# Patient Record
Sex: Female | Born: 1985 | Race: Black or African American | Hispanic: No | Marital: Married | Smoking: Never smoker
Health system: Southern US, Community
[De-identification: ages and names within clinical notes are randomized; demographics above are authoritative.]

## PROBLEM LIST (undated history)

## (undated) DIAGNOSIS — J45909 Unspecified asthma, uncomplicated: Secondary | ICD-10-CM

## (undated) DIAGNOSIS — L309 Dermatitis, unspecified: Secondary | ICD-10-CM

## (undated) HISTORY — PX: NO PAST SURGERIES: SHX2092

## (undated) HISTORY — DX: Dermatitis, unspecified: L30.9

---

## 2018-05-05 ENCOUNTER — Encounter: Payer: Self-pay | Admitting: Obstetrics

## 2018-05-05 ENCOUNTER — Ambulatory Visit (INDEPENDENT_AMBULATORY_CARE_PROVIDER_SITE_OTHER): Payer: Managed Care, Other (non HMO) | Admitting: Obstetrics

## 2018-05-05 VITALS — BP 121/76 | HR 111 | Wt 133.0 lb

## 2018-05-05 DIAGNOSIS — Z3482 Encounter for supervision of other normal pregnancy, second trimester: Secondary | ICD-10-CM

## 2018-05-05 DIAGNOSIS — Z1151 Encounter for screening for human papillomavirus (HPV): Secondary | ICD-10-CM | POA: Diagnosis not present

## 2018-05-05 DIAGNOSIS — Z113 Encounter for screening for infections with a predominantly sexual mode of transmission: Secondary | ICD-10-CM

## 2018-05-05 DIAGNOSIS — N898 Other specified noninflammatory disorders of vagina: Secondary | ICD-10-CM

## 2018-05-05 DIAGNOSIS — Z124 Encounter for screening for malignant neoplasm of cervix: Secondary | ICD-10-CM

## 2018-05-05 DIAGNOSIS — Z3490 Encounter for supervision of normal pregnancy, unspecified, unspecified trimester: Secondary | ICD-10-CM | POA: Insufficient documentation

## 2018-05-05 MED ORDER — VITAFOL-NANO 18-0.6-0.4 MG PO TABS: 1.0000 | ORAL_TABLET | Freq: Every day | ORAL | 4 refills | Status: AC

## 2018-05-05 NOTE — Progress Notes (Signed)
Subjective:    Gwendolyn Wright is being seen today for her first obstetrical visit.  This is a planned pregnancy. She is at [redacted]w[redacted]d gestation. Her obstetrical history is significant for none. Relationship with FOB: spouse, living together. Patient does intend to breast feed. Pregnancy history fully reviewed.  The information documented in the HPI was reviewed and verified.  Menstrual History: OB History    Gravida  1   Para      Term      Preterm      AB      Living        SAB      TAB      Ectopic      Multiple      Live Births               Patient's last menstrual period was 01/10/2018.    History reviewed. No pertinent past medical history.  History reviewed. No pertinent surgical history.   (Not in a hospital admission) Allergies  Allergen Reactions  . Peanut-Containing Drug Products     Social History   Tobacco Use  . Smoking status: Never Smoker  . Smokeless tobacco: Never Used  Substance Use Topics  . Alcohol use: Not Currently    Family History  Problem Relation Age of Onset  . Uterine cancer Mother   . Hypertension Mother   . Diabetes Mother   . Anemia Mother      Review of Systems Constitutional: negative for weight loss Gastrointestinal: negative for vomiting Genitourinary:negative for genital lesions and vaginal discharge and dysuria Musculoskeletal:negative for back pain Behavioral/Psych: negative for abusive relationship, depression, illegal drug usage and tobacco use    Objective:    BP 121/76   Pulse (!) 111   Wt 133 lb (60.3 kg)   LMP 01/10/2018  General Appearance:    Alert, cooperative, no distress, appears stated age  Head:    Normocephalic, without obvious abnormality, atraumatic  Eyes:    PERRL, conjunctiva/corneas clear, EOM's intact, fundi    benign, both eyes  Ears:    Normal TM's and external ear canals, both ears  Nose:   Nares normal, septum midline, mucosa normal, no drainage    or sinus tenderness  Throat:    Lips, mucosa, and tongue normal; teeth and gums normal  Neck:   Supple, symmetrical, trachea midline, no adenopathy;    thyroid:  no enlargement/tenderness/nodules; no carotid   bruit or JVD  Back:     Symmetric, no curvature, ROM normal, no CVA tenderness  Lungs:     Clear to auscultation bilaterally, respirations unlabored  Chest Wall:    No tenderness or deformity   Heart:    Regular rate and rhythm, S1 and S2 normal, no murmur, rub   or gallop  Breast Exam:    No tenderness, masses, or nipple abnormality  Abdomen:     Soft, non-tender, bowel sounds active all four quadrants,    no masses, no organomegaly  Genitalia:    Normal female without lesion, discharge or tenderness  Extremities:   Extremities normal, atraumatic, no cyanosis or edema  Pulses:   2+ and symmetric all extremities  Skin:   Skin color, texture, turgor normal, no rashes or lesions  Lymph nodes:   Cervical, supraclavicular, and axillary nodes normal  Neurologic:   CNII-XII intact, normal strength, sensation and reflexes    throughout      Lab Review Urine pregnancy test Labs reviewed yes Radiologic studies reviewed  no Assessment:    Pregnancy at [redacted]w[redacted]d weeks    Plan:     1. Encounter for supervision of normal pregnancy, antepartum, unspecified gravidity Rx: - Obstetric Panel, Including HIV - Culture, OB Urine - Genetic Screening - Hemoglobinopathy evaluation - Cystic Fibrosis Mutation 97 - SMN1 COPY NUMBER ANALYSIS (SMA Carrier Screen) - Cytology - PAP - AFP, Serum, Open Spina Bifida - [place] MFM OB COMP + 53 WK; Future  2. Vaginal discharge Rx: - Cervicovaginal ancillary only  Prenatal vitamins.  Counseling provided regarding continued use of seat belts, cessation of alcohol consumption, smoking or use of illicit drugs; infection precautions i.e., influenza/TDAP immunizations, toxoplasmosis,CMV, parvovirus, listeria and varicella; workplace safety, exercise during pregnancy; routine dental care, safe  medications, sexual activity, hot tubs, saunas, pools, travel, caffeine use, fish and methlymercury, potential toxins, hair treatments, varicose veins Weight gain recommendations per IOM guidelines reviewed: underweight/BMI< 18.5--> gain 28 - 40 lbs; normal weight/BMI 18.5 - 24.9--> gain 25 - 35 lbs; overweight/BMI 25 - 29.9--> gain 15 - 25 lbs; obese/BMI >30->gain  11 - 20 lbs Problem list reviewed and updated. FIRST/CF mutation testing/NIPT/QUAD SCREEN/fragile X/Ashkenazi Jewish population testing/Spinal muscular atrophy discussed: requested. Role of ultrasound in pregnancy discussed; fetal survey: requested. Amniocentesis discussed: not indicated.  No orders of the defined types were placed in this encounter.  Orders Placed This Encounter  Procedures  . Culture, OB Urine  . [place] MFM OB COMP + 14 WK    Standing Status:   Future    Standing Expiration Date:   07/06/2019    Order Specific Question:   Reason for Exam (SYMPTOM  OR DIAGNOSIS REQUIRED)    Answer:   Anatomy    Order Specific Question:   Preferred Imaging Location?    Answer:   [facility]  . Obstetric Panel, Including HIV  . Genetic Screening    [facility]  . Hemoglobinopathy evaluation  . Cystic Fibrosis Mutation 97  . SMN1 COPY NUMBER ANALYSIS (SMA Carrier Screen)  . AFP, Serum, Open Spina Bifida    Order Specific Question:   Is patient insulin dependent?    Answer:   No    Order Specific Question:   Patient weight (lb.)    Answer:   133 lb (60.3 kg)    Order Specific Question:   Gestational Age (GA), weeks    Answer:   85.3    Order Specific Question:   Date on which patient was at this GA    Answer:   05/05/2018    Order Specific Question:   GA Calculation Method    Answer:   LMP    Order Specific Question:   Number of fetuses    Answer:   1    Follow up in 4 weeks. 50% of 20 min visit spent on counseling and coordination of care.     Shelly Bombard MD 05-05-2018

## 2018-05-06 LAB — CERVICOVAGINAL ANCILLARY ONLY
Chlamydia: NEGATIVE
Neisseria Gonorrhea: NEGATIVE
TRICH (WINDOWPATH): NEGATIVE

## 2018-05-07 LAB — CYTOLOGY - PAP
Diagnosis: NEGATIVE
HPV: NOT DETECTED

## 2018-05-07 LAB — AFP, SERUM, OPEN SPINA BIFIDA
AFP MoM: 0.73
AFP Value: 29.5 ng/mL
Gest. Age on Collection Date: 16.3 weeks
Maternal Age At EDD: 32.4 yr
OSBR Risk 1 IN: 10000
Test Results:: NEGATIVE
Weight: 133 [lb_av]

## 2018-05-07 LAB — CULTURE, OB URINE

## 2018-05-07 LAB — URINE CULTURE, OB REFLEX

## 2018-05-08 ENCOUNTER — Other Ambulatory Visit: Payer: Self-pay | Admitting: Obstetrics

## 2018-05-08 DIAGNOSIS — O99019 Anemia complicating pregnancy, unspecified trimester: Secondary | ICD-10-CM

## 2018-05-08 LAB — OBSTETRIC PANEL, INCLUDING HIV
Antibody Screen: NEGATIVE
BASOS ABS: 0 10*3/uL (ref 0.0–0.2)
Basos: 1 %
EOS (ABSOLUTE): 0.6 10*3/uL — ABNORMAL HIGH (ref 0.0–0.4)
Eos: 6 %
HIV SCREEN 4TH GENERATION: NONREACTIVE
Hematocrit: 31.4 % — ABNORMAL LOW (ref 34.0–46.6)
Hemoglobin: 10.7 g/dL — ABNORMAL LOW (ref 11.1–15.9)
Hepatitis B Surface Ag: NEGATIVE
IMMATURE GRANULOCYTES: 1 %
Immature Grans (Abs): 0.1 10*3/uL (ref 0.0–0.1)
LYMPHS ABS: 1.4 10*3/uL (ref 0.7–3.1)
Lymphs: 16 %
MCH: 29.1 pg (ref 26.6–33.0)
MCHC: 34.1 g/dL (ref 31.5–35.7)
MCV: 85 fL (ref 79–97)
MONOS ABS: 0.7 10*3/uL (ref 0.1–0.9)
Monocytes: 7 %
NEUTROS ABS: 6.2 10*3/uL (ref 1.4–7.0)
NEUTROS PCT: 69 %
PLATELETS: 197 10*3/uL (ref 150–450)
RBC: 3.68 x10E6/uL — ABNORMAL LOW (ref 3.77–5.28)
RDW: 15.2 % (ref 12.3–15.4)
RH TYPE: POSITIVE
RPR: NONREACTIVE
Rubella Antibodies, IGG: 5.04 index (ref >0.99)
WBC: 8.9 10*3/uL (ref 3.4–10.8)

## 2018-05-08 LAB — HEMOGLOBINOPATHY EVALUATION
HGB A: 97.7 % (ref 96.4–98.8)
HGB C: 0 %
HGB S: 0 %
HGB VARIANT: 0 %
Hemoglobin A2 Quantitation: 2.3 % (ref 1.8–3.2)
Hemoglobin F Quantitation: 0 % (ref 0.0–2.0)

## 2018-05-08 MED ORDER — FERROUS SULFATE 325 (65 FE) MG PO TABS: 325.0000 mg | ORAL_TABLET | Freq: Two times a day (BID) | ORAL | 5 refills | Status: AC

## 2018-05-12 LAB — SMN1 COPY NUMBER ANALYSIS (SMA CARRIER SCREENING)

## 2018-05-13 ENCOUNTER — Telehealth: Payer: Self-pay | Admitting: *Deleted

## 2018-05-13 LAB — CYSTIC FIBROSIS MUTATION 97: Interpretation: NOT DETECTED

## 2018-05-13 NOTE — Telephone Encounter (Signed)
Placed call to pt to make aware that blood sample for Panorama was insufficient for testing. Pt was offered an appt in order to have redrawn or she may have drawn at next OB visit. Pt is unsure of what she would like to do, will discuss with husband and also has transportation issues.  Pt made aware she may call back at any time and make [place] aware of what she can do.

## 2018-05-20 ENCOUNTER — Encounter (HOSPITAL_COMMUNITY): Payer: Self-pay

## 2018-05-27 ENCOUNTER — Ambulatory Visit: Payer: Managed Care, Other (non HMO)

## 2018-05-27 ENCOUNTER — Ambulatory Visit (HOSPITAL_COMMUNITY)
Admission: RE | Admit: 2018-05-27 | Discharge: 2018-05-27 | Disposition: A | Discharge status: Home / Self Care | Payer: Managed Care, Other (non HMO) | Source: Ambulatory Visit | Attending: Obstetrics | Admitting: Obstetrics

## 2018-05-27 DIAGNOSIS — Z363 Encounter for antenatal screening for malformations: Secondary | ICD-10-CM | POA: Diagnosis present

## 2018-05-27 DIAGNOSIS — Z3A19 19 weeks gestation of pregnancy: Secondary | ICD-10-CM | POA: Diagnosis not present

## 2018-05-27 DIAGNOSIS — O3412 Maternal care for benign tumor of corpus uteri, second trimester: Secondary | ICD-10-CM | POA: Insufficient documentation

## 2018-05-27 DIAGNOSIS — D259 Leiomyoma of uterus, unspecified: Secondary | ICD-10-CM | POA: Diagnosis not present

## 2018-05-27 DIAGNOSIS — Z3490 Encounter for supervision of normal pregnancy, unspecified, unspecified trimester: Secondary | ICD-10-CM

## 2018-05-28 ENCOUNTER — Other Ambulatory Visit (HOSPITAL_COMMUNITY): Payer: Self-pay | Admitting: *Deleted

## 2018-05-28 DIAGNOSIS — Z362 Encounter for other antenatal screening follow-up: Secondary | ICD-10-CM

## 2018-06-02 ENCOUNTER — Ambulatory Visit (INDEPENDENT_AMBULATORY_CARE_PROVIDER_SITE_OTHER): Payer: Managed Care, Other (non HMO) | Admitting: Obstetrics and Gynecology

## 2018-06-02 ENCOUNTER — Encounter: Payer: Self-pay | Admitting: Obstetrics and Gynecology

## 2018-06-02 VITALS — BP 113/76 | HR 111 | Ht 65.5 in | Wt 138.0 lb

## 2018-06-02 DIAGNOSIS — Z3492 Encounter for supervision of normal pregnancy, unspecified, second trimester: Secondary | ICD-10-CM

## 2018-06-02 NOTE — Patient Instructions (Signed)

## 2018-06-02 NOTE — Progress Notes (Signed)
   PRENATAL VISIT NOTE  Subjective:  Gwendolyn Wright is a 32 y.o. G1P0 at [redacted]w[redacted]d being seen today for ongoing prenatal care.  She is currently monitored for the following issues for this low-risk pregnancy and has Supervision of normal pregnancy on their problem list.  Patient reports no complaints.  Contractions: Not present. Vag. Bleeding: None.  Movement: Present. Denies leaking of fluid.   The following portions of the patient's history were reviewed and updated as appropriate: allergies, current medications, past family history, past medical history, past social history, past surgical history and problem list. Problem list updated.  Objective:   Vitals:   06/02/18 1558 06/02/18 1558  BP: 113/76   Pulse: (!) 111   Weight: 138 lb (62.6 kg)   Height:  5' 5.5" (1.664 m)    Fetal Status: Fetal Heart Rate (bpm): 152 Fundal Height: 19 cm Movement: Present     General:  Alert, oriented and cooperative. Patient is in no acute distress.  Skin: Skin is warm and dry. No rash noted.   Cardiovascular: Normal heart rate noted  Respiratory: Normal respiratory effort, no problems with respiration noted  Abdomen: Soft, gravid, appropriate for gestational age.  Pain/Pressure: Absent     Pelvic: Cervical exam deferred        Extremities: Normal range of motion.  Edema: None  Mental Status: Normal mood and affect. Normal behavior. Normal judgment and thought content.   Assessment and Plan:  Pregnancy: G1P0 at [redacted]w[redacted]d  1. Encounter for supervision of normal pregnancy in second trimester, unspecified gravidity Patient is doing well without complaints Flu vaccine and panorama today Follow up anatomy scheduled  Preterm labor symptoms and general obstetric precautions including but not limited to vaginal bleeding, contractions, leaking of fluid and fetal movement were reviewed in detail with the patient. Please refer to After Visit Summary for other counseling recommendations.  No follow-ups on  file.  Future Appointments  Date Time Provider [place]  07/01/2018  4:00 PM [facility] [place] [facility]    Mora Bellman, MD

## 2018-06-09 ENCOUNTER — Encounter: Payer: Self-pay | Admitting: Obstetrics and Gynecology

## 2018-06-19 ENCOUNTER — Encounter: Payer: Self-pay | Admitting: Obstetrics

## 2018-07-01 ENCOUNTER — Ambulatory Visit (INDEPENDENT_AMBULATORY_CARE_PROVIDER_SITE_OTHER): Payer: Managed Care, Other (non HMO) | Admitting: Obstetrics

## 2018-07-01 ENCOUNTER — Encounter: Payer: Self-pay | Admitting: Obstetrics

## 2018-07-01 ENCOUNTER — Ambulatory Visit (HOSPITAL_COMMUNITY)
Admission: RE | Admit: 2018-07-01 | Discharge: 2018-07-01 | Disposition: A | Discharge status: Home / Self Care | Payer: Managed Care, Other (non HMO) | Source: Ambulatory Visit | Attending: Obstetrics | Admitting: Obstetrics

## 2018-07-01 ENCOUNTER — Other Ambulatory Visit: Payer: Self-pay

## 2018-07-01 VITALS — BP 108/69 | HR 93 | Wt 141.5 lb

## 2018-07-01 DIAGNOSIS — O3412 Maternal care for benign tumor of corpus uteri, second trimester: Secondary | ICD-10-CM | POA: Diagnosis present

## 2018-07-01 DIAGNOSIS — Z362 Encounter for other antenatal screening follow-up: Secondary | ICD-10-CM | POA: Insufficient documentation

## 2018-07-01 DIAGNOSIS — D259 Leiomyoma of uterus, unspecified: Secondary | ICD-10-CM | POA: Insufficient documentation

## 2018-07-01 DIAGNOSIS — Z3492 Encounter for supervision of normal pregnancy, unspecified, second trimester: Secondary | ICD-10-CM

## 2018-07-01 DIAGNOSIS — Z3A24 24 weeks gestation of pregnancy: Secondary | ICD-10-CM | POA: Diagnosis not present

## 2018-07-01 DIAGNOSIS — Z3490 Encounter for supervision of normal pregnancy, unspecified, unspecified trimester: Secondary | ICD-10-CM

## 2018-07-01 DIAGNOSIS — M549 Dorsalgia, unspecified: Secondary | ICD-10-CM

## 2018-07-01 MED ORDER — COMFORT FIT MATERNITY SUPP SM MISC: 0 refills | Status: DC

## 2018-07-01 NOTE — Progress Notes (Signed)
Subjective:  Gwendolyn Wright is a 32 y.o. G1P0 at [redacted]w[redacted]d being seen today for ongoing prenatal care.  She is currently monitored for the following issues for this low-risk pregnancy and has Supervision of normal pregnancy on their problem list.  Patient reports no complaints.  Contractions: Not present. Vag. Bleeding: None.  Movement: Present. Denies leaking of fluid.   The following portions of the patient's history were reviewed and updated as appropriate: allergies, current medications, past family history, past medical history, past social history, past surgical history and problem list. Problem list updated.  Objective:   Vitals:   07/01/18 1442  BP: 108/69  Pulse: 93  Weight: 141 lb 8 oz (64.2 kg)    Fetal Status: Fetal Heart Rate (bpm): 150   Movement: Present     General:  Alert, oriented and cooperative. Patient is in no acute distress.  Skin: Skin is warm and dry. No rash noted.   Cardiovascular: Normal heart rate noted  Respiratory: Normal respiratory effort, no problems with respiration noted  Abdomen: Soft, gravid, appropriate for gestational age. Pain/Pressure: Absent     Pelvic:  Cervical exam deferred        Extremities: Normal range of motion.  Edema: None  Mental Status: Normal mood and affect. Normal behavior. Normal judgment and thought content.   Urinalysis:      Assessment and Plan:  Pregnancy: G1P0 at [redacted]w[redacted]d  1. Encounter for supervision of normal pregnancy, antepartum, unspecified gravidity  2. Backache without radiation  - Elastic Bandages & Supports (COMFORT FIT MATERNITY SUPP SM) MISC; Wear as directed.  Dispense: 1 each; Refill: 0  Preterm labor symptoms and general obstetric precautions including but not limited to vaginal bleeding, contractions, leaking of fluid and fetal movement were reviewed in detail with the patient. Please refer to After Visit Summary for other counseling recommendations.  Return in about 4 weeks (around 07/29/2018) for ROB, 2  hour OGTT.   Shelly Bombard, MD

## 2018-07-01 NOTE — Patient Instructions (Addendum)
Exercise During Pregnancy For people of all ages, exercise is an important part of being healthy. Exercise improves heart and lung function and helps to maintain strength, flexibility, and a healthy body weight. Exercise also boosts energy levels and elevates mood. For most women, maintaining an exercise routine throughout pregnancy is recommended. It is only on rare occasions and with certain medical conditions or pregnancy complications that women may be asked to limit or avoid exercise during pregnancy. What are some other benefits to exercising during pregnancy? Along with maintaining strength and flexibility, exercising throughout pregnancy can help to:  Keep strength in muscles that are very important during labor and childbirth.  Decrease low back pain during pregnancy.  Decrease the risk of developing gestational diabetes mellitus (GDM).  Improve blood sugar (glucose) control for women who have GDM.  Decrease the risk of developing preeclampsia. This is a serious condition that causes high blood pressure along with other symptoms, such as swelling and headaches.  Decrease the risk of cesarean delivery.  Speed up the recovery after giving birth.  How often should I exercise? Unless your health care provider gives you different instructions, you should try to exercise on most days or all days of the week. In general, try to exercise with moderate intensity for about 150 minutes per week. This can be spread out across several days, such as exercising for 30 minutes per day on 5 days of each week. You can tell that you are exercising at a moderate intensity if you have a higher heart rate and faster breathing, but you are still able to hold a conversation. What types of moderate-intensity exercise are recommended during pregnancy? There are many types of exercise that are safe for you to do during pregnancy. Unless your health care provider gives you different instructions, do a variety of  exercises that safely increase your heart and breathing (cardiopulmonary) rates and help you to build and maintain muscle strength (strength training). You should always be able to talk in full sentences while exercising during pregnancy. Some examples of exercising that is safe to do during pregnancy include:  Brisk walking or hiking.  Swimming.  Water aerobics.  Riding a stationary bike.  Strength training.  Modified yoga or Pilates. Tell your instructor that you are pregnant. Avoid overstretching and avoid lying on your back for long periods of time.  Running or jogging. Only choose this type of exercise if: ? You ran or jogged regularly before your pregnancy. ? You can run or jog and still talk in complete sentences.  What types of exercise should I not do during pregnancy? Depending on your level of fitness and whether you exercised regularly before your pregnancy, you may be advised to limit vigorous-intensity exercise during your pregnancy. You can tell that you are exercising at a vigorous intensity if you are breathing much harder and faster and cannot hold a conversation while exercising. Some examples of exercising that you should avoid during pregnancy include:  Contact sports.  Activities that place you at risk for falling on or being hit in the belly, such as downhill skiing, water skiing, surfing, rock climbing, cycling, gymnastics, and horseback riding.  Scuba diving.  Sky diving.  Yoga or Pilates in a room that is heated to extreme temperatures ("hot yoga" or "hot Pilates").  Jogging or running, unless you ran or jogged regularly before your pregnancy. While jogging or running, you should always be able to talk in full sentences. Do not run or jog so vigorously  that you are unable to have a conversation.  If you are not used to exercising at elevation (more than 6,000 feet above sea level), do not do so during your pregnancy.  When should I avoid exercising  during pregnancy? Certain medical conditions can make it unsafe to exercise during pregnancy, or they may increase your risk of miscarriage or early labor and birth. Some of these conditions include:  Some types of heart disease.  Some types of lung disease.  Placenta previa. This is when the placenta partially or completely covers the opening of the uterus (cervix).  Frequent bleeding from the vagina during your pregnancy.  Incompetent cervix. This is when your cervix does not remain as tightly closed during pregnancy as it should.  Premature labor.  Ruptured membranes. This is when the protective sac (amniotic sac) opens up and amniotic fluid leaks from your vagina.  Severely low blood count (anemia).  Preeclampsia or pregnancy-caused high blood pressure.  Carrying more than one baby (multiple gestation) and having an additional risk of early labor.  Poorly controlled diabetes.  Being severely underweight or severely overweight.  Intrauterine growth restriction. This is when your baby's growth and development during pregnancy are slower than expected.  Other medical conditions. Ask your health care provider if any apply to you.  What else should I know about exercising during pregnancy? You should take these precautions while exercising during pregnancy:  Avoid overheating. ? Wear loose-fitting, breathable clothes. ? Do not exercise in very high temperatures.  Avoid dehydration. Drink enough water before, during, and after exercise to keep your urine clear or pale yellow.  Avoid overstretching. Because of hormone changes during pregnancy, it is easy to overstretch muscles, tendons, and ligaments during pregnancy.  Start slowly and ask your health care provider to recommend types of exercise that are safe for you, if exercising regularly is new for you.  Pregnancy is not a time for exercising to lose weight. When should I seek medical care? You should stop exercising  and call your health care provider if you have any unusual symptoms, such as:  Mild uterine contractions or abdominal cramping.  Dizziness that does not improve with rest.  When should I seek immediate medical care? You should stop exercising and call your local emergency services (911 in the [place] if you have any unusual symptoms, such as:  Sudden, severe pain in your low back or your belly.  Uterine contractions or abdominal cramping that do not improve with rest.  Chest pain.  Bleeding or fluid leaking from your vagina.  Shortness of breath.  This information is not intended to replace advice given to you by your health care provider. Make sure you discuss any questions you have with your health care provider. Document Released: 07/30/2005 Document Revised: 12/28/2015 Document Reviewed: 10/07/2014 Elsevier Interactive Patient Education  2018 [employer].  Pregnancy and Travel Most pregnant woman can safely travel until the last month of the pregnancy. However, pregnant women with medical problems or problems with their pregnancy should limit or avoid travel. The best time to travel is between 14 and 28 weeks of the pregnancy. During this period, morning sickness should be minimal and other problems are less likely to develop. General travel tips Before you go:  Discuss your trip with your health care provider and get examined shortly before you go.  Get a copy of your medical records and be sure to take them with you.  Try to get names of doctors and hospitals in  the area you will be visiting.  Pack your pillow if you can.  Get a good night's sleep the night before you make your trip.  During your trip:  Ask for locations of doctors and hospitals if you did not do this before leaving.  Wear flat, comfortable shoes.  Eat a balanced diet, drink a lot of fluids, and take your vitamins and supplements.  Do not wear yourself out.  Do not ride on a motorcycle.  Rest.  If you spent a lot of time traveling, lie down for 30 or more minutes with your feet slightly raised after you reach your destination.  Tips for traveling to a foreign country Before you go:  Ask your health care provider if there are any medicines that are safe to take if you get diarrhea, constipation, nausea, or vomiting.  Make copies of your medical records in case you lose the originals.  During your trip:  Do not eat uncooked foods of any kind.  Drink bottled water and do not use ice.  Wash fruits and vegetables with hot, soapy water.  Only drink pasteurized milk.  Tips for traveling by car  Wear your seat belt properly.  If you are in the front seat, sit as far away from the dashboard as possible to avoid getting hit hard if the air bag deploys in an accident.  Stop about every 2 hours to use the restroom and walk around. This helps the circulation in your legs.  Keep water, crackers, and fruit in the car.  Do not travel for more than 6 hours a day. Tips for traveling by bus  Before making a reservation, ask whether your bus will have a restroom.  Take water, crackers, and fruit with you.  Get out and walk around if and when the bus stops.  Move your arms and legs when seated. This helps with your circulation. Tips for traveling by train Before making a reservation, ask if your train will have a sleeping car and more than one restroom. Tips for traveling by airplane  Before booking your trip, ask about the airline's rules about pregnancy. Pregnant women may be restricted from [place] after a certain time of the pregnancy. Every airline has its own rules and regulations.  Ask whether the airplane cabin will be pressurized. Do not board an unpressurized plane that will fly above 7,000 ft (2,100 km).  Try to get a bulkhead or an aisle seat.  Wear layered clothing because the temperature in the cabin can change.  Take water, crackers, and fruit with you on the  airplane.  Put all your medicines and medical records in your carry-on bag.  Avoid drinks with caffeine and do not eat a big meal.  Do not walk around the airplane to stretch your legs.  Move your arms and legs while sitting to help with your circulation.  Wear your seat belt at all times. Tips for traveling by cruise ship  Before booking your trip, ask the following questions: ? Are pregnant women allowed on the cruise ship? ? Is there a medical facility and health care provider on board? ? Does the ship dock in cities where there are health care providers and medical facilities?  Before booking your trip, ask your health care provider if: ? It is safe for you to take medicines if you get seasick. ? It is safe for you to wear acupressure wristbands to prevent getting seasick. If your health care provider says it is safe, consider  purchasing one. This information is not intended to replace advice given to you by your health care provider. Make sure you discuss any questions you have with your health care provider. Document Released: 07/12/2008 Document Revised: 01/05/2016 Document Reviewed: 06/26/2013 Elsevier Interactive Patient Education  2017 [place]. Glucose Tolerance Test During Pregnancy The glucose tolerance test (GTT) is a blood test used to determine if you have developed a type of diabetes during pregnancy (gestational diabetes). This is when your body does not properly process sugar (glucose) in the food you eat, resulting in high blood glucose levels. Typically, a GTT is done after you have had a 1-hour glucose test with results that indicate you possibly have gestational diabetes. It may also be done if:  You have a history of giving birth to very large babies or have experienced repeated fetal loss (stillbirth).  You have signs and symptoms of diabetes, such as: ? Changes in your vision. ? Tingling or numbness in your hands or feet. ? Changes in hunger, thirst, and  urination not otherwise explained by your pregnancy.  The GTT lasts about 3 hours. You will be given a sugar-water solution to drink at the beginning of the test. You will have blood drawn before you drink the solution and then again 1, 2, and 3 hours after you drink it. You will not be allowed to eat or drink anything else during the test. You must remain at the testing location to make sure that your blood is drawn on time. You should also avoid exercising during the test, because exercise can alter test results. How do I prepare for this test? Eat normally for 3 days prior to the GTT test, including having plenty of carbohydrate-rich foods. Do not eat or drink anything except water during the final 12 hours before the test. In addition, your health care provider may ask you to stop taking certain medicines before the test. What do the results mean? It is your responsibility to obtain your test results. Ask the lab or department performing the test when and how you will get your results. Contact your health care provider to discuss any questions you have about your results. Range of Normal Values Ranges for normal values may vary among different labs and hospitals. You should always check with your health care provider after having lab work or other tests done to discuss whether your values are considered within normal limits. Normal levels of blood glucose are as follows:  Fasting: less than 105 mg/dL.  1 hour after drinking the solution: less than 190 mg/dL.  2 hours after drinking the solution: less than 165 mg/dL.  3 hours after drinking the solution: less than 145 mg/dL.  Some substances can interfere with GTT results. These may include:  Blood pressure and heart failure medicines, including beta blockers, furosemide, and thiazides.  Anti-inflammatory medicines, including aspirin.  Nicotine.  Some psychiatric medicines.  Meaning of Results Outside Normal Value Ranges GTT test  results that are above normal values may indicate a number of health problems, such as:  Gestational diabetes.  Acute stress response.  Cushing syndrome.  Tumors such as pheochromocytoma or glucagonoma.  Long-term kidney problems.  Pancreatitis.  Hyperthyroidism.  Current infection.  Discuss your test results with your health care provider. He or she will use the results to make a diagnosis and determine a treatment plan that is right for you. This information is not intended to replace advice given to you by your health care provider. Make sure  you discuss any questions you have with your health care provider. Document Released: 01/29/2012 Document Revised: 01/05/2016 Document Reviewed: 12/04/2013 Elsevier Interactive Patient Education  [employer].

## 2018-07-29 ENCOUNTER — Encounter: Payer: Self-pay | Admitting: Obstetrics

## 2018-07-29 ENCOUNTER — Ambulatory Visit (INDEPENDENT_AMBULATORY_CARE_PROVIDER_SITE_OTHER): Payer: Managed Care, Other (non HMO) | Admitting: Obstetrics

## 2018-07-29 ENCOUNTER — Other Ambulatory Visit: Payer: Managed Care, Other (non HMO)

## 2018-07-29 ENCOUNTER — Other Ambulatory Visit: Payer: Self-pay

## 2018-07-29 VITALS — BP 113/70 | HR 102 | Wt 147.3 lb

## 2018-07-29 DIAGNOSIS — Z3492 Encounter for supervision of normal pregnancy, unspecified, second trimester: Secondary | ICD-10-CM

## 2018-07-29 NOTE — Progress Notes (Signed)
ROB/GTT.  Patient deferred TDAP until next visit.

## 2018-07-29 NOTE — Progress Notes (Signed)
Subjective:  Gwendolyn Wright is a 32 y.o. G1P0 at [redacted]w[redacted]d being seen today for ongoing prenatal care.  She is currently monitored for the following issues for this low-risk pregnancy and has Supervision of normal pregnancy on their problem list.  Patient reports no complaints.  Contractions: Not present. Vag. Bleeding: None.  Movement: Present. Denies leaking of fluid.   The following portions of the patient's history were reviewed and updated as appropriate: allergies, current medications, past family history, past medical history, past social history, past surgical history and problem list. Problem list updated.  Objective:   Vitals:   07/29/18 0818  BP: 113/70  Pulse: (!) 102  Weight: 147 lb 4.8 oz (66.8 kg)    Fetal Status:     Movement: Present     General:  Alert, oriented and cooperative. Patient is in no acute distress.  Skin: Skin is warm and dry. No rash noted.   Cardiovascular: Normal heart rate noted  Respiratory: Normal respiratory effort, no problems with respiration noted  Abdomen: Soft, gravid, appropriate for gestational age. Pain/Pressure: Present     Pelvic:  Cervical exam deferred        Extremities: Normal range of motion.  Edema: None  Mental Status: Normal mood and affect. Normal behavior. Normal judgment and thought content.   Urinalysis:      Assessment and Plan:  Pregnancy: G1P0 at [redacted]w[redacted]d  1. Encounter for supervision of normal pregnancy in second trimester, unspecified gravidity Rx: - Glucose Tolerance, 2 Hours w/1 Hour - CBC - HIV antibody (with reflex) - RPR  Preterm labor symptoms and general obstetric precautions including but not limited to vaginal bleeding, contractions, leaking of fluid and fetal movement were reviewed in detail with the patient. Please refer to After Visit Summary for other counseling recommendations.  Return in about 2 weeks (around 08/12/2018) for ROB.   Shelly Bombard, MD

## 2018-07-30 LAB — CBC
Hematocrit: 36 % (ref 34.0–46.6)
Hemoglobin: 12.1 g/dL (ref 11.1–15.9)
MCH: 32.1 pg (ref 26.6–33.0)
MCHC: 33.6 g/dL (ref 31.5–35.7)
MCV: 96 fL (ref 79–97)
Platelets: 150 10*3/uL (ref 150–450)
RBC: 3.77 x10E6/uL (ref 3.77–5.28)
RDW: 12.9 % (ref 12.3–15.4)
WBC: 9.1 10*3/uL (ref 3.4–10.8)

## 2018-07-30 LAB — GLUCOSE TOLERANCE, 2 HOURS W/ 1HR
Glucose, 1 hour: 150 mg/dL (ref 65–179)
Glucose, 2 hour: 99 mg/dL (ref 65–152)
Glucose, Fasting: 65 mg/dL (ref 65–91)

## 2018-07-30 LAB — RPR: RPR Ser Ql: NONREACTIVE

## 2018-07-30 LAB — HIV ANTIBODY (ROUTINE TESTING W REFLEX): HIV Screen 4th Generation wRfx: NONREACTIVE

## 2018-08-13 NOTE — L&D Delivery Note (Signed)
Delivery Note At 8:53 AM a viable female was delivered via waterbirth  (Presentation:ROT ; restitution to ROA).  No nuchal present. Shoulder and body delivered in usual fashion. Newborn guided to patient's chest with assistance from [facility] and RN. Infant with spontaneous cry shortly after. IM Pitocin administered while patient still in tub. Cord clamped x 2 and cut by patient's husband after 8 minute delay. Patient assisted to bed, newborn in husband's arms. Placenta status: intact, delivered with gentle cord traction and good maternal effort.  Cord: three vessels with no complications. Fundus firm with massage and Pitocin. Labia, perineum, vagina, and cervix inspected inspected with left labial laceration.   APGAR: 9, 9; weight 2685g .   Cord pH: not collected  Anesthesia:  Lidocaine administered for perineal repair Episiotomy:  N/A Lacerations:  1st degree left labial, not hemostatic Suture Repair: 3.0 vicryl Est. Blood Loss (mL):  150  Mom to postpartum.  Baby to Couplet care / Skin to Skin.  Darlina Rumpf, CNM 10/05/2018, 10:27 AM

## 2018-08-14 ENCOUNTER — Ambulatory Visit (INDEPENDENT_AMBULATORY_CARE_PROVIDER_SITE_OTHER): Payer: Managed Care, Other (non HMO) | Admitting: Obstetrics

## 2018-08-14 VITALS — BP 116/77 | HR 98 | Wt 152.0 lb

## 2018-08-14 DIAGNOSIS — Z3493 Encounter for supervision of normal pregnancy, unspecified, third trimester: Secondary | ICD-10-CM

## 2018-08-14 DIAGNOSIS — Z3490 Encounter for supervision of normal pregnancy, unspecified, unspecified trimester: Secondary | ICD-10-CM

## 2018-08-14 NOTE — Progress Notes (Signed)
Subjective:  Gwendolyn Wright is a 33 y.o. G1P0 at [redacted]w[redacted]d being seen today for ongoing prenatal care.  She is currently monitored for the following issues for this low-risk pregnancy and has Supervision of normal pregnancy on their problem list.  Patient reports no complaints.  Contractions: Not present. Vag. Bleeding: None.  Movement: Present. Denies leaking of fluid.   The following portions of the patient's history were reviewed and updated as appropriate: allergies, current medications, past family history, past medical history, past social history, past surgical history and problem list. Problem list updated.  Objective:   Vitals:   08/14/18 1508  BP: 116/77  Pulse: 98  Weight: 152 lb (68.9 kg)    Fetal Status:     Movement: Present     General:  Alert, oriented and cooperative. Patient is in no acute distress.  Skin: Skin is warm and dry. No rash noted.   Cardiovascular: Normal heart rate noted  Respiratory: Normal respiratory effort, no problems with respiration noted  Abdomen: Soft, gravid, appropriate for gestational age. Pain/Pressure: Absent     Pelvic:  Cervical exam deferred        Extremities: Normal range of motion.     Mental Status: Normal mood and affect. Normal behavior. Normal judgment and thought content.   Urinalysis:      Assessment and Plan:  Pregnancy: G1P0 at [redacted]w[redacted]d  1. Encounter for supervision of normal pregnancy, antepartum, unspecified gravidity  2. SGA (small for gestational age) Rx: - US OB Follow Up; Future  Preterm labor symptoms and general obstetric precautions including but not limited to vaginal bleeding, contractions, leaking of fluid and fetal movement were reviewed in detail with the patient. Please refer to After Visit Summary for other counseling recommendations.  Return in about 2 weeks (around 08/28/2018) for [place].   Shelly Bombard, MD

## 2018-08-15 ENCOUNTER — Encounter: Payer: Self-pay | Admitting: Obstetrics

## 2018-08-19 ENCOUNTER — Other Ambulatory Visit (HOSPITAL_COMMUNITY): Payer: Self-pay | Admitting: Obstetrics

## 2018-08-19 ENCOUNTER — Ambulatory Visit (HOSPITAL_COMMUNITY)
Admission: RE | Admit: 2018-08-19 | Discharge: 2018-08-19 | Disposition: A | Discharge status: Home / Self Care | Payer: Managed Care, Other (non HMO) | Source: Ambulatory Visit | Attending: Obstetrics | Admitting: Obstetrics

## 2018-08-19 DIAGNOSIS — Z3A31 31 weeks gestation of pregnancy: Secondary | ICD-10-CM | POA: Diagnosis not present

## 2018-08-19 DIAGNOSIS — O26843 Uterine size-date discrepancy, third trimester: Secondary | ICD-10-CM | POA: Diagnosis not present

## 2018-08-28 ENCOUNTER — Encounter: Payer: Self-pay | Admitting: Obstetrics

## 2018-08-28 ENCOUNTER — Ambulatory Visit (INDEPENDENT_AMBULATORY_CARE_PROVIDER_SITE_OTHER): Payer: Managed Care, Other (non HMO) | Admitting: Obstetrics

## 2018-08-28 DIAGNOSIS — Z3490 Encounter for supervision of normal pregnancy, unspecified, unspecified trimester: Secondary | ICD-10-CM

## 2018-08-28 NOTE — Progress Notes (Signed)
Subjective:  Gwendolyn Wright is a 33 y.o. G1P0 at [redacted]w[redacted]d being seen today for ongoing prenatal care.  She is currently monitored for the following issues for this low-risk pregnancy and has Supervision of normal pregnancy on their problem list.  Patient reports heartburn.   .  .   . Denies leaking of fluid.   The following portions of the patient's history were reviewed and updated as appropriate: allergies, current medications, past family history, past medical history, past social history, past surgical history and problem list. Problem list updated.  Objective:  There were no vitals filed for this visit.  Fetal Status:           General:  Alert, oriented and cooperative. Patient is in no acute distress.  Skin: Skin is warm and dry. No rash noted.   Cardiovascular: Normal heart rate noted  Respiratory: Normal respiratory effort, no problems with respiration noted  Abdomen: Soft, gravid, appropriate for gestational age.       Pelvic:  Cervical exam deferred        Extremities: Normal range of motion.     Mental Status: Normal mood and affect. Normal behavior. Normal judgment and thought content.   Urinalysis:      Assessment and Plan:  Pregnancy: G1P0 at [redacted]w[redacted]d  1. Encounter for supervision of normal pregnancy, antepartum, unspecified gravidity   Preterm labor symptoms and general obstetric precautions including but not limited to vaginal bleeding, contractions, leaking of fluid and fetal movement were reviewed in detail with the patient. Please refer to After Visit Summary for other counseling recommendations.  Return in about 2 weeks (around 09/11/2018) for [place].   Shelly Bombard, MD

## 2018-09-11 ENCOUNTER — Ambulatory Visit (INDEPENDENT_AMBULATORY_CARE_PROVIDER_SITE_OTHER): Payer: Managed Care, Other (non HMO) | Admitting: Certified Nurse Midwife

## 2018-09-11 ENCOUNTER — Other Ambulatory Visit: Payer: Self-pay

## 2018-09-11 ENCOUNTER — Encounter: Payer: Self-pay | Admitting: Certified Nurse Midwife

## 2018-09-11 VITALS — BP 110/74 | HR 92 | Wt 151.0 lb

## 2018-09-11 DIAGNOSIS — Z3403 Encounter for supervision of normal first pregnancy, third trimester: Secondary | ICD-10-CM

## 2018-09-11 NOTE — Progress Notes (Signed)
ROB.  Reports no problems today. 

## 2018-09-11 NOTE — Progress Notes (Signed)
   PRENATAL VISIT NOTE  Subjective:  Gwendolyn Wright is a 33 y.o. G1P0 at [redacted]w[redacted]d being seen today for ongoing prenatal care.  She is currently monitored for the following issues for this low-risk pregnancy and has Supervision of normal pregnancy on their problem list.  Patient reports no complaints.  Contractions: Not present. Vag. Bleeding: None.  Movement: Present. Denies leaking of fluid.   The following portions of the patient's history were reviewed and updated as appropriate: allergies, current medications, past family history, past medical history, past social history, past surgical history and problem list. Problem list updated.  Objective:   Vitals:   09/11/18 1604  BP: 110/74  Pulse: 92  Weight: 151 lb (68.5 kg)    Fetal Status: Fetal Heart Rate (bpm): 156 Fundal Height: 31 cm Movement: Present     General:  Alert, oriented and cooperative. Patient is in no acute distress.  Skin: Skin is warm and dry. No rash noted.   Cardiovascular: Normal heart rate noted  Respiratory: Normal respiratory effort, no problems with respiration noted  Abdomen: Soft, gravid, appropriate for gestational age.  Pain/Pressure: Absent     Pelvic: Cervical exam deferred        Extremities: Normal range of motion.  Edema: None  Mental Status: Normal mood and affect. Normal behavior. Normal judgment and thought content.   Assessment and Plan:  Pregnancy: G1P0 at [redacted]w[redacted]d  1. Encounter for supervision of normal first pregnancy in third trimester - Patient doing well, no complaints - Educated and discussed waterbirth and supplies needed, class completed in November certificate scanned in today, needs consent signed  - Routine prenatal care and anticipatory guidance on upcoming appointment with GBS screening   Preterm labor symptoms and general obstetric precautions including but not limited to vaginal bleeding, contractions, leaking of fluid and fetal movement were reviewed in detail with the  patient. Please refer to After Visit Summary for other counseling recommendations.  Return in about 4 days (around 09/15/2018) for ROB.  Future Appointments  Date Time Provider [place]  09/17/2018  4:15 PM Lajean Manes, CNM [place] None    Lajean Manes, [name]

## 2018-09-11 NOTE — Patient Instructions (Addendum)
Considering Waterbirth? Guide for patients at [facility]  Why consider waterbirth?  . Gentle birth for babies . Less pain medicine used in labor . May allow for passive descent/less pushing . May reduce perineal tears  . More mobility and instinctive maternal position changes . Increased maternal relaxation . Reduced blood pressure in labor  Is waterbirth safe? What are the risks of infection, drowning or other complications?  . Infection: o Very low risk (3.7 % for tub vs 4.8% for bed) o 7 in 8000 waterbirths with documented infection o Poorly cleaned equipment most common cause o Slightly lower group B strep transmission rate  . Drowning o Maternal:  - Very low risk   - Related to seizures or fainting o Newborn:  - Very low risk. No evidence of increased risk of respiratory problems in multiple large studies - Physiological protection from breathing under water - Avoid underwater birth if there are any fetal complications - Once baby's head is out of the water, keep it out.  . Birth complication o Some reports of cord trauma, but risk decreased by bringing baby to surface gradually o No evidence of increased risk of shoulder dystocia. Mothers can usually change positions faster in water than in a bed, possibly aiding the maneuvers to free the shoulder.   You must attend a Waterbirth class at [facility]  3rd Wednesday of every month from 7-9pm  Free  Register by calling [phone] or online at [email or web address]  Bring us the certificate from the class to your prenatal appointment  Meet with a midwife at 36 weeks to see if you can still plan a waterbirth and to sign the consent.   If you plan a waterbirth after October 05, 2018, at [facility], you will need to purchase the following:  Fish net  Bathing suit top (optional)  Long-handled mirror (optional)  If you plan a waterbirth before  October 05, 2018: Purchase or rent the following supplies: You are responsible for providing all supplies listed above. **If you do not have all necessary supplies you cannot have a waterbirth.**   Water Birth Pool (Birth Pool in a Box or [place] for instance)  (Tubs start ~$125)  Single-use disposable tub liner designed for your brand of tub  Electric drain pump to remove water (We recommend 792 gallon per hour or greater pump.)   New garden hose labeled "lead-free", "suitable for drinking water",  Separate garden hose to remove the dirty water  Fish net  Bathing suit top (optional)  Long-handled mirror (optional)  Places to purchase or rent supplies:   Yourwaterbirth.com for tub purchases and supplies  Waterbirthsolutions.com for tub purchases and supplies  [employer] (www.[email or web address]) $275 for tub rental/set-up & take down/kit   [place] [employer] (http://www.padanc.org/MeetUs.[email or web address]) Information regarding doulas (labor support) who provide pool rentals  Things that would prevent you from having a waterbirth:  Premature, <37wks  Previous cesarean birth  Presence of thick meconium-stained fluid  Multiple gestation (Twins, triplets, etc.)  Uncontrolled diabetes or gestational diabetes requiring medication  Hypertension requiring medication or diagnosis of pre-eclampsia  Heavy vaginal bleeding  Non-reassuring fetal heart rate  Active infection (MRSA, etc.). Group B Strep is NOT a contraindication for waterbirth.  If your labor has to be induced and induction method requires continuous monitoring of the baby's heart rate  Other risks/issues identified by your obstetrical provider  Please remember that birth is unpredictable. Under certain unforeseeable circumstances your   provider may advise against giving birth in the tub. These decisions will be made on a case-by-case basis and with the safety of you and your baby as our highest  priority.    

## 2018-09-17 ENCOUNTER — Encounter: Payer: Self-pay | Admitting: Certified Nurse Midwife

## 2018-09-17 ENCOUNTER — Ambulatory Visit (INDEPENDENT_AMBULATORY_CARE_PROVIDER_SITE_OTHER): Payer: Managed Care, Other (non HMO) | Admitting: Certified Nurse Midwife

## 2018-09-17 DIAGNOSIS — Z3403 Encounter for supervision of normal first pregnancy, third trimester: Secondary | ICD-10-CM

## 2018-09-17 NOTE — Patient Instructions (Signed)
Td Vaccine (Tetanus and Diphtheria): What You Need to Know 1. Why get vaccinated? Tetanus  and diphtheria are very serious diseases. They are rare in the [place] today, but people who do become infected often have severe complications. Td vaccine is used to protect adolescents and adults from both of these diseases. Both tetanus and diphtheria are infections caused by bacteria. Diphtheria spreads from person to person through coughing or sneezing. Tetanus-causing bacteria enter the body through cuts, scratches, or wounds. TETANUS (Lockjaw) causes painful muscle tightening and stiffness, usually all over the body.  It can lead to tightening of muscles in the head and neck so you can't open your mouth, swallow, or sometimes even breathe. Tetanus kills about 1 out of every 10 people who are infected even after receiving the best medical care. DIPHTHERIA can cause a thick coating to form in the back of the throat.  It can lead to breathing problems, paralysis, heart failure, and death. Before vaccines, as many as 200,000 cases of diphtheria and hundreds of cases of tetanus were reported in the [place] each year. Since vaccination began, reports of cases for both diseases have dropped by about 99%. 2. Td vaccine Td vaccine can protect adolescents and adults from tetanus and diphtheria. Td is usually given as a booster dose every 10 years but it can also be given earlier after a severe and dirty wound or burn. Another vaccine, called Tdap, which protects against pertussis in addition to tetanus and diphtheria, is sometimes recommended instead of Td vaccine. Your doctor or the person giving you the vaccine can give you more information. Td may safely be given at the same time as other vaccines. 3. Some people should not get this vaccine  A person who has ever had a life-threatening allergic reaction after a previous dose of any tetanus or diphtheria containing vaccine, OR has a severe allergy  to any part of this vaccine, should not get Td vaccine. Tell the person giving the vaccine about any severe allergies.  Talk to your doctor if you: ? had severe pain or swelling after any vaccine containing diphtheria or tetanus, ? ever had a condition called Guillain Barr Syndrome (GBS), ? aren't feeling well on the day the shot is scheduled. 4. Risks of a vaccine reaction With any medicine, including vaccines, there is a chance of side effects. These are usually mild and go away on their own. Serious reactions are also possible but are rare. Most people who get Td vaccine do not have any problems with it. Mild Problems following Td vaccine: (Did not interfere with activities)  Pain where the shot was given (about 8 people in 10)  Redness or swelling where the shot was given (about 1 person in 4)  Mild fever (rare)  Headache (about 1 person in 4)  Tiredness (about 1 person in 4) Moderate Problems following Td vaccine: (Interfered with activities, but did not require medical attention)  Fever over 102F (rare) Severe Problems following Td vaccine: (Unable to perform usual activities; required medical attention)  Swelling, severe pain, bleeding and/or redness in the arm where the shot was given (rare). Problems that could happen after any vaccine:  People sometimes faint after a medical procedure, including vaccination. Sitting or lying down for about 15 minutes can help prevent fainting, and injuries caused by a fall. Tell your doctor if you feel dizzy, or have vision changes or ringing in the ears.  Some people get severe pain in the shoulder and have   difficulty moving the arm where a shot was given. This happens very rarely.  Any medication can cause a severe allergic reaction. Such reactions from a vaccine are very rare, estimated at fewer than 1 in a million doses, and would happen within a few minutes to a few hours after the vaccination. As with any medicine, there is a  very remote chance of a vaccine causing a serious injury or death. The safety of vaccines is always being monitored. For more information, visit: www.cdc.gov/vaccinesafety/ 5. What if there is a serious reaction? What should I look for?  Look for anything that concerns you, such as signs of a severe allergic reaction, very high fever, or unusual behavior. Signs of a severe allergic reaction can include hives, swelling of the face and throat, difficulty breathing, a fast heartbeat, dizziness, and weakness. These would usually start a few minutes to a few hours after the vaccination. What should I do?  If you think it is a severe allergic reaction or other emergency that can't wait, call 9-1-1 or get the person to the nearest hospital. Otherwise, call your doctor.  Afterward, the reaction should be reported to the Vaccine Adverse Event Reporting System (VAERS). Your doctor might file this report, or you can do it yourself through the VAERS web site at www.vaers.hhs.gov, or by calling 1-800-822-7967. VAERS does not give medical advice. 6. The National Vaccine Injury Compensation Program The National Vaccine Injury Compensation Program (VICP) is a federal program that was created to compensate people who may have been injured by certain vaccines. Persons who believe they may have been injured by a vaccine can learn about the program and about filing a claim by calling 1-800-338-2382 or visiting the VICP website at www.hrsa.gov/vaccinecompensation. There is a time limit to file a claim for compensation. 7. How can I learn more?  Ask your doctor. He or she can give you the vaccine package insert or suggest other sources of information.  Call your local or state health department.  Contact the Centers for Disease Control and Prevention (CDC): ? Call 1-800-232-4636 (1-800-CDC-INFO) ? Visit CDC's website at www.cdc.gov/vaccines Vaccine Information Statement Td Vaccine (11/22/15) This information is  not intended to replace advice given to you by your health care provider. Make sure you discuss any questions you have with your health care provider. Document Released: 05/27/2006 Document Revised: 03/17/2018 Document Reviewed: 03/17/2018 Elsevier Interactive Patient Education  2019 Elsevier Inc.  

## 2018-09-18 NOTE — Progress Notes (Signed)
   PRENATAL VISIT NOTE  Subjective:  Gwendolyn Wright is a 33 y.o. G1P0 at [redacted]w[redacted]d being seen today for ongoing prenatal care.  She is currently monitored for the following issues for this low-risk pregnancy and has Supervision of normal pregnancy on their problem list.  Patient reports no complaints.  Contractions: Not present. Vag. Bleeding: None.  Movement: Present. Denies leaking of fluid.   The following portions of the patient's history were reviewed and updated as appropriate: allergies, current medications, past family history, past medical history, past social history, past surgical history and problem list. Problem list updated.  Objective:   Vitals:   09/17/18 1606  BP: 120/82  Pulse: 93  Weight: 152 lb (68.9 kg)    Fetal Status: Fetal Heart Rate (bpm): 160 Fundal Height: 32 cm Movement: Present     General:  Alert, oriented and cooperative. Patient is in no acute distress.  Skin: Skin is warm and dry. No rash noted.   Cardiovascular: Normal heart rate noted  Respiratory: Normal respiratory effort, no problems with respiration noted  Abdomen: Soft, gravid, appropriate for gestational age.  Pain/Pressure: Absent     Pelvic: Cervical exam deferred        Extremities: Normal range of motion.  Edema: None  Mental Status: Normal mood and affect. Normal behavior. Normal judgment and thought content.   Assessment and Plan:  Pregnancy: G1P0 at [redacted]w[redacted]d  1. Encounter for supervision of normal first pregnancy in third trimester - Patient doing well, no complaints - Anticipatory guidance on upcoming appointments with GBS screening at next appointment  - Waterbirth consent signed today in office  - Encouraged patient to attend tour at [facility] [address] on 2/15 to look at birthing tub   Preterm labor symptoms and general obstetric precautions including but not limited to vaginal bleeding, contractions, leaking of fluid and fetal movement were reviewed in detail with the patient. Please refer to  After Visit Summary for other counseling recommendations.  Return in about 1 week (around 09/24/2018) for [place].  Future Appointments  Date Time Provider [place]  09/24/2018  4:15 PM Ephraim Hamburger, Rona Ravens, NP [place] None    Lajean Manes, CNM

## 2018-09-24 ENCOUNTER — Ambulatory Visit (INDEPENDENT_AMBULATORY_CARE_PROVIDER_SITE_OTHER): Payer: Managed Care, Other (non HMO) | Admitting: Nurse Practitioner

## 2018-09-24 VITALS — BP 132/80 | HR 88 | Wt 155.0 lb

## 2018-09-24 DIAGNOSIS — Z3A36 36 weeks gestation of pregnancy: Secondary | ICD-10-CM

## 2018-09-24 DIAGNOSIS — Z3403 Encounter for supervision of normal first pregnancy, third trimester: Secondary | ICD-10-CM

## 2018-09-24 DIAGNOSIS — Z113 Encounter for screening for infections with a predominantly sexual mode of transmission: Secondary | ICD-10-CM | POA: Diagnosis not present

## 2018-09-24 NOTE — Patient Instructions (Signed)

## 2018-09-24 NOTE — Progress Notes (Signed)
    Subjective:  Gwendolyn Wright is a 32 y.o. G1P0 at [redacted]w[redacted]d being seen today for ongoing prenatal care.  She is currently monitored for the following issues for this low-risk pregnancy and has Supervision of normal pregnancy on their problem list.  Patient reports hot in the office today.  skin is damp.  No illness..  Contractions: Not present. Vag. Bleeding: None.  Movement: Present. Denies leaking of fluid.   The following portions of the patient's history were reviewed and updated as appropriate: allergies, current medications, past family history, past medical history, past social history, past surgical history and problem list. Problem list updated.  Objective:   Vitals:   09/24/18 1613  BP: 132/80  Pulse: 88  Weight: 155 lb (70.3 kg)    Fetal Status: Fetal Heart Rate (bpm): 144 Fundal Height: 34 cm Movement: Present     General:  Alert, oriented and cooperative. Patient is in no acute distress.  Skin: Skin is warm and dry. No rash noted.   Cardiovascular: Normal heart rate noted  Respiratory: Normal respiratory effort, no problems with respiration noted  Abdomen: Soft, gravid, appropriate for gestational age. Pain/Pressure: Absent     Pelvic:  Cervical exam deferred        Extremities: Normal range of motion.  Edema: None  Mental Status: Normal mood and affect. Normal behavior. Normal judgment and thought content.   Urinalysis:      Assessment and Plan:  Pregnancy: G1P0 at [redacted]w[redacted]d  1. Encounter for supervision of normal first pregnancy in third trimester Going on tour of new hospital on Saturday.  Baby is moving well.  Signed consent for Water birth last visit.  Vaginal swabs done.  Encouraged to sign up for my chart.  - Culture, beta strep (group b only) - Cervicovaginal ancillary only( [place])  Preterm labor symptoms and general obstetric precautions including but not limited to vaginal bleeding, contractions, leaking of fluid and fetal movement were reviewed in  detail with the patient. Please refer to After Visit Summary for other counseling recommendations.  Return in about 1 week (around 10/01/2018).  Earlie Server, RN, MSN, NP-BC Nurse Practitioner, [facility] for [employer], [facility] 09/24/2018 4:39 PM

## 2018-09-25 LAB — CERVICOVAGINAL ANCILLARY ONLY
Chlamydia: NEGATIVE
Neisseria Gonorrhea: NEGATIVE

## 2018-09-28 LAB — CULTURE, BETA STREP (GROUP B ONLY): Strep Gp B Culture: NEGATIVE

## 2018-10-01 ENCOUNTER — Ambulatory Visit (INDEPENDENT_AMBULATORY_CARE_PROVIDER_SITE_OTHER): Payer: Managed Care, Other (non HMO) | Admitting: Certified Nurse Midwife

## 2018-10-01 ENCOUNTER — Encounter: Payer: Self-pay | Admitting: Certified Nurse Midwife

## 2018-10-01 VITALS — BP 118/78 | HR 98 | Wt 158.0 lb

## 2018-10-01 DIAGNOSIS — Z3403 Encounter for supervision of normal first pregnancy, third trimester: Secondary | ICD-10-CM

## 2018-10-01 DIAGNOSIS — Z3A37 37 weeks gestation of pregnancy: Secondary | ICD-10-CM

## 2018-10-01 NOTE — Patient Instructions (Signed)
Reasons to go to MAU:  1.  Contractions are  5 minutes apart or less, each last 1 minute, these have been going on for 1-2 hours, and you cannot walk or talk during them 2.  You have a large gush of fluid, or a trickle of fluid that will not stop and you have to wear a pad 3.  You have bleeding that is bright red, heavier than spotting--like menstrual bleeding (spotting can be normal in early labor or after a check of your cervix) 4.  You do not feel the baby moving like he/she normally does  

## 2018-10-02 NOTE — Progress Notes (Signed)
   PRENATAL VISIT NOTE  Subjective:  Gwendolyn Wright is a 33 y.o. G1P0 at [redacted]w[redacted]d being seen today for ongoing prenatal care.  She is currently monitored for the following issues for this low-risk pregnancy and has Supervision of normal pregnancy on their problem list.  Patient reports no complaints.  Contractions: Not present. Vag. Bleeding: None.  Movement: Present. Denies leaking of fluid.   The following portions of the patient's history were reviewed and updated as appropriate: allergies, current medications, past family history, past medical history, past social history, past surgical history and problem list. Problem list updated.  Objective:   Vitals:   10/01/18 1601  BP: 118/78  Pulse: 98  Weight: 158 lb (71.7 kg)    Fetal Status: Fetal Heart Rate (bpm): 160 Fundal Height: 35 cm Movement: Present     General:  Alert, oriented and cooperative. Patient is in no acute distress.  Skin: Skin is warm and dry. No rash noted.   Cardiovascular: Normal heart rate noted  Respiratory: Normal respiratory effort, no problems with respiration noted  Abdomen: Soft, gravid, appropriate for gestational age.  Pain/Pressure: Absent     Pelvic: Cervical exam deferred        Extremities: Normal range of motion.  Edema: None  Mental Status: Normal mood and affect. Normal behavior. Normal judgment and thought content.   Assessment and Plan:  Pregnancy: G1P0 at [redacted]w[redacted]d  1. Encounter for supervision of normal first pregnancy in third trimester - Patient doing well, no complaints - Anticipatory guidance on upcoming appointments - Discussed use of EPO, RRT and IC to induce contractions at home  - Labor precautions discussed  - Educated on cervical check and membrane sweep around 39 weeks if spontaneous labor has not occurred  Term labor symptoms and general obstetric precautions including but not limited to vaginal bleeding, contractions, leaking of fluid and fetal movement were reviewed in detail  with the patient. Please refer to After Visit Summary for other counseling recommendations.  Return in about 1 week (around 10/08/2018) for ROB.  Future Appointments  Date Time Provider [place]  10/08/2018  4:00 PM Virginia Rochester, NP [place] None    Lajean Manes, [name]

## 2018-10-04 ENCOUNTER — Inpatient Hospital Stay (HOSPITAL_COMMUNITY)
Admission: AD | Admit: 2018-10-04 | Discharge: 2018-10-04 | Disposition: A | Discharge status: Home / Self Care | Payer: Managed Care, Other (non HMO) | Source: Ambulatory Visit | Attending: Obstetrics & Gynecology | Admitting: Obstetrics & Gynecology

## 2018-10-04 ENCOUNTER — Encounter (HOSPITAL_COMMUNITY): Payer: Self-pay

## 2018-10-04 DIAGNOSIS — Z3A38 38 weeks gestation of pregnancy: Secondary | ICD-10-CM

## 2018-10-04 DIAGNOSIS — O471 False labor at or after 37 completed weeks of gestation: Secondary | ICD-10-CM

## 2018-10-04 NOTE — MAU Note (Signed)
I have communicated with Derrill Memo CNM and reviewed vital signs:  Vitals:   10/04/18 0503 10/04/18 0534  BP: 117/86   Pulse: (!) 131 (!) 118  Resp: 20   Temp: 97.9 F (36.6 C)     Vaginal exam:  Dilation: 3 Effacement (%): 90 Station: -2 Presentation: Vertex Exam by:: Maryagnes Amos RN,   Also reviewed contraction pattern and that non-stress test is reactive.  It has been documented that patient is contracting every 8-9 minutes with minimal cervical change over 1 hour not indicating active labor.  Patient denies any other complaints. Pt requesting to be discharged home since she is planning a water birth. Based on this report provider has given order for discharge.  A discharge order and diagnosis entered by a provider.   Labor discharge instructions reviewed with patient.

## 2018-10-04 NOTE — MAU Note (Signed)
Pt reports menstrual cramping and some vaginal spotting tonight. Pt denies LOF or bright red vaginal bleeding. Reports good fetal movement. Pt thinks that she is constipated. LBM: 10/03/2018. Cervix has not been examined. Planning water birth.

## 2018-10-04 NOTE — MAU Note (Signed)
Pt states that since she has made minimal cervical change she would prefer to be discharged home since she is planning a water birth. Derrill Memo CNM notified and discharge orders placed.

## 2018-10-04 NOTE — Discharge Instructions (Signed)
Braxton Hicks Contractions Contractions of the uterus can occur throughout pregnancy, but they are not always a sign that you are in labor. You may have practice contractions called Braxton Hicks contractions. These false labor contractions are sometimes confused with true labor. What are Braxton Hicks contractions? Braxton Hicks contractions are tightening movements that occur in the muscles of the uterus before labor. Unlike true labor contractions, these contractions do not result in opening (dilation) and thinning of the cervix. Toward the end of pregnancy (32-34 weeks), Braxton Hicks contractions can happen more often and may become stronger. These contractions are sometimes difficult to tell apart from true labor because they can be very uncomfortable. You should not feel embarrassed if you go to the hospital with false labor. Sometimes, the only way to tell if you are in true labor is for your health care provider to look for changes in the cervix. The health care provider will do a physical exam and may monitor your contractions. If you are not in true labor, the exam should show that your cervix is not dilating and your water has not broken. If there are no other health problems associated with your pregnancy, it is completely safe for you to be sent home with false labor. You may continue to have Braxton Hicks contractions until you go into true labor. How to tell the difference between true labor and false labor True labor  Contractions last 30-70 seconds.  Contractions become very regular.  Discomfort is usually felt in the top of the uterus, and it spreads to the lower abdomen and low back.  Contractions do not go away with walking.  Contractions usually become more intense and increase in frequency.  The cervix dilates and gets thinner. False labor  Contractions are usually shorter and not as strong as true labor contractions.  Contractions are usually irregular.  Contractions  are often felt in the front of the lower abdomen and in the groin.  Contractions may go away when you walk around or change positions while lying down.  Contractions get weaker and are shorter-lasting as time goes on.  The cervix usually does not dilate or become thin. Follow these instructions at home:   Take over-the-counter and prescription medicines only as told by your health care provider.  Keep up with your usual exercises and follow other instructions from your health care provider.  Eat and drink lightly if you think you are going into labor.  If Braxton Hicks contractions are making you uncomfortable: ? Change your position from lying down or resting to walking, or change from walking to resting. ? Sit and rest in a tub of warm water. ? Drink enough fluid to keep your urine pale yellow. Dehydration may cause these contractions. ? Do slow and deep breathing several times an hour.  Keep all follow-up prenatal visits as told by your health care provider. This is important. Contact a health care provider if:  You have a fever.  You have continuous pain in your abdomen. Get help right away if:  Your contractions become stronger, more regular, and closer together.  You have fluid leaking or gushing from your vagina.  You pass blood-tinged mucus (bloody show).  You have bleeding from your vagina.  You have low back pain that you never had before.  You feel your baby's head pushing down and causing pelvic pressure.  Your baby is not moving inside you as much as it used to. Summary  Contractions that occur before labor are   called Braxton Hicks contractions, false labor, or practice contractions.  Braxton Hicks contractions are usually shorter, weaker, farther apart, and less regular than true labor contractions. True labor contractions usually become progressively stronger and regular, and they become more frequent.  Manage discomfort from Braxton Hicks contractions  by changing position, resting in a warm bath, drinking plenty of water, or practicing deep breathing. This information is not intended to replace advice given to you by your health care provider. Make sure you discuss any questions you have with your health care provider. Document Released: 12/13/2016 Document Revised: 05/14/2017 Document Reviewed: 12/13/2016 Elsevier Interactive Patient Education  2019 Elsevier Inc.  

## 2018-10-05 ENCOUNTER — Other Ambulatory Visit: Payer: Self-pay

## 2018-10-05 ENCOUNTER — Encounter (HOSPITAL_COMMUNITY): Payer: Self-pay

## 2018-10-05 ENCOUNTER — Inpatient Hospital Stay (HOSPITAL_COMMUNITY)
Admission: AD | Admit: 2018-10-05 | Discharge: 2018-10-07 | DRG: 807 | Disposition: A | Discharge status: Home / Self Care | Payer: Managed Care, Other (non HMO) | Attending: Obstetrics & Gynecology | Admitting: Obstetrics & Gynecology

## 2018-10-05 DIAGNOSIS — Z3A38 38 weeks gestation of pregnancy: Secondary | ICD-10-CM

## 2018-10-05 DIAGNOSIS — Z3483 Encounter for supervision of other normal pregnancy, third trimester: Secondary | ICD-10-CM | POA: Diagnosis present

## 2018-10-05 HISTORY — DX: Unspecified asthma, uncomplicated: J45.909

## 2018-10-05 LAB — CBC
HCT: 41.9 % (ref 36.0–46.0)
Hemoglobin: 14.1 g/dL (ref 12.0–15.0)
MCH: 32 pg (ref 26.0–34.0)
MCHC: 33.7 g/dL (ref 30.0–36.0)
MCV: 95.2 fL (ref 80.0–100.0)
Platelets: 142 10*3/uL — ABNORMAL LOW (ref 150–400)
RBC: 4.4 MIL/uL (ref 3.87–5.11)
RDW: 13.2 % (ref 11.5–15.5)
WBC: 14.8 10*3/uL — ABNORMAL HIGH (ref 4.0–10.5)
nRBC: 0 % (ref 0.0–0.2)

## 2018-10-05 LAB — RPR: RPR: NONREACTIVE

## 2018-10-05 LAB — TYPE AND SCREEN
ABO/RH(D): A POS
Antibody Screen: NEGATIVE

## 2018-10-05 LAB — ABO/RH: ABO/RH(D): A POS

## 2018-10-05 MED ORDER — FENTANYL CITRATE (PF) 100 MCG/2ML IJ SOLN
INTRAMUSCULAR | Status: AC
  Administered 2018-10-05: 100 ug
  Filled 2018-10-05: qty 2

## 2018-10-05 MED ORDER — LIDOCAINE HCL (PF) 1 % IJ SOLN
INTRAMUSCULAR | Status: AC
  Filled 2018-10-05: qty 30

## 2018-10-05 MED ORDER — ACETAMINOPHEN 325 MG PO TABS: 650.0000 mg | ORAL_TABLET | ORAL | Status: DC | PRN

## 2018-10-05 MED ORDER — DIBUCAINE 1 % RE OINT
1.0000 "application " | TOPICAL_OINTMENT | RECTAL | Status: DC | PRN
  Filled 2018-10-05: qty 28

## 2018-10-05 MED ORDER — DIPHENHYDRAMINE HCL 25 MG PO CAPS: 25.0000 mg | ORAL_CAPSULE | Freq: Four times a day (QID) | ORAL | Status: DC | PRN

## 2018-10-05 MED ORDER — OXYTOCIN 40 UNITS IN NORMAL SALINE INFUSION - SIMPLE MED: 2.5000 [IU]/h | INTRAVENOUS | Status: DC

## 2018-10-05 MED ORDER — LIDOCAINE HCL (PF) 1 % IJ SOLN
30.0000 mL | INTRAMUSCULAR | Status: DC | PRN
  Administered 2018-10-05: 30 mL via SUBCUTANEOUS
  Filled 2018-10-05: qty 30

## 2018-10-05 MED ORDER — OXYTOCIN 10 UNIT/ML IJ SOLN
INTRAMUSCULAR | Status: AC
  Administered 2018-10-05: 10 [IU]
  Filled 2018-10-05: qty 1

## 2018-10-05 MED ORDER — ONDANSETRON HCL 4 MG/2ML IJ SOLN: 4.0000 mg | INTRAMUSCULAR | Status: DC | PRN

## 2018-10-05 MED ORDER — OXYTOCIN BOLUS FROM INFUSION: 500.0000 mL | Freq: Once | INTRAVENOUS | Status: DC

## 2018-10-05 MED ORDER — OXYCODONE-ACETAMINOPHEN 5-325 MG PO TABS: 1.0000 | ORAL_TABLET | ORAL | Status: DC | PRN

## 2018-10-05 MED ORDER — BENZOCAINE-MENTHOL 20-0.5 % EX AERO
1.0000 "application " | INHALATION_SPRAY | CUTANEOUS | Status: DC | PRN
  Administered 2018-10-06: 1 via TOPICAL
  Filled 2018-10-05 (×2): qty 56

## 2018-10-05 MED ORDER — MAGNESIUM HYDROXIDE 400 MG/5ML PO SUSP: 30.0000 mL | ORAL | Status: DC | PRN

## 2018-10-05 MED ORDER — OXYCODONE-ACETAMINOPHEN 5-325 MG PO TABS: 2.0000 | ORAL_TABLET | ORAL | Status: DC | PRN

## 2018-10-05 MED ORDER — ONDANSETRON HCL 4 MG/2ML IJ SOLN: 4.0000 mg | Freq: Four times a day (QID) | INTRAMUSCULAR | Status: DC | PRN

## 2018-10-05 MED ORDER — SIMETHICONE 80 MG PO CHEW: 80.0000 mg | CHEWABLE_TABLET | ORAL | Status: DC | PRN

## 2018-10-05 MED ORDER — WITCH HAZEL-GLYCERIN EX PADS: 1.0000 "application " | MEDICATED_PAD | CUTANEOUS | Status: DC | PRN

## 2018-10-05 MED ORDER — IBUPROFEN 600 MG PO TABS
600.0000 mg | ORAL_TABLET | Freq: Four times a day (QID) | ORAL | Status: DC
  Administered 2018-10-05 – 2018-10-07 (×9): 600 mg via ORAL
  Filled 2018-10-05 (×9): qty 1

## 2018-10-05 MED ORDER — COCONUT OIL OIL
1.0000 "application " | TOPICAL_OIL | Status: DC | PRN
  Administered 2018-10-06: 1 via TOPICAL
  Filled 2018-10-05: qty 120

## 2018-10-05 MED ORDER — LACTATED RINGERS IV SOLN
INTRAVENOUS | Status: DC
  Administered 2018-10-05 (×2): via INTRAVENOUS

## 2018-10-05 MED ORDER — ONDANSETRON HCL 4 MG PO TABS: 4.0000 mg | ORAL_TABLET | ORAL | Status: DC | PRN

## 2018-10-05 MED ORDER — FERROUS SULFATE 325 (65 FE) MG PO TABS
325.0000 mg | ORAL_TABLET | Freq: Two times a day (BID) | ORAL | Status: DC
  Administered 2018-10-05 – 2018-10-07 (×4): 325 mg via ORAL
  Filled 2018-10-05 (×4): qty 1

## 2018-10-05 MED ORDER — SOD CITRATE-CITRIC ACID 500-334 MG/5ML PO SOLN: 30.0000 mL | ORAL | Status: DC | PRN

## 2018-10-05 MED ORDER — FENTANYL CITRATE (PF) 100 MCG/2ML IJ SOLN: 100.0000 ug | Freq: Once | INTRAMUSCULAR | Status: DC

## 2018-10-05 MED ORDER — PRENATAL MULTIVITAMIN CH
1.0000 | ORAL_TABLET | Freq: Every day | ORAL | Status: DC
  Administered 2018-10-05 – 2018-10-07 (×3): 1 via ORAL
  Filled 2018-10-05 (×3): qty 1

## 2018-10-05 MED ORDER — LACTATED RINGERS IV SOLN: 500.0000 mL | INTRAVENOUS | Status: DC | PRN

## 2018-10-05 NOTE — MAU Note (Signed)
Pt reports ctx since last night. Denies LOC but reports some mucous like dc.  Pt reports good fetal movement.

## 2018-10-05 NOTE — Progress Notes (Signed)
Gwendolyn Wright is a 33 y.o. G1P0 at [redacted]w[redacted]d admitted for SOL --Category I tracing --AROM now clear fluid --9.5/100/+1 --Tub filled, Norman Herrlich, DNP,CNM on standby to proctor waterbirth --At bedside for expectant management  Mallie Snooks, CNM 10/05/18  8:21 AM

## 2018-10-05 NOTE — Lactation Note (Signed)
This note was copied from a baby's chart. Lactation Consultation Note  Patient Name: Gwendolyn Wright [id number] Date: 10/05/2018  Reason for consult: Initial assessment   P1 mom. LC  Taught hand expression.  Collected approx. 1 ml into colostrum container.  Helped mom position infant in football hold.  Taught dad to position pillows and rolled blankets to help support baby and mom.  Mom has large breast with semi flat nipples and very short shaft, but with compressible breast tissue.  [place] worked with mom on sandwiching  breast to assist with infant getting a deep latch with more breast tissue.  LC had dad use teacup hold to protrude tissue out more in order to easier latch.    Infant latched well with flanged lips, rhythmic jaw movement noted, swallows were heard with compressions and LC pointed this out to family so they could listen for these.  BF basics reviewed with family.  At least 8-12 feeds in 24 hours, feeding cues reviewed, STS, and hand exp. And collection of milk.  DEBP set up due to <6 lbs.  Set up, cleaning, milk storage and collection reviewed with family.  LC encouraged mom to pump after feeds but to hand exp. Prior to and after pumping to collect and feed back to infant via spoon or other tool.  When family is ready after bf; they were encouraged to call out for assistance with feeding colostrum back to infant.    OP services and BFSG information shared with family.    LC encouraged mom to call out for further questions or assistance with feeds.         Maternal Data Formula Feeding for Exclusion: No Has patient been taught Hand Expression?: Yes Does the patient have breastfeeding experience prior to this delivery?: No  Feeding Feeding Type: Breast Fed  LATCH Score Latch: Grasps breast easily, tongue down, lips flanged, rhythmical sucking.  Audible Swallowing: A few with stimulation  Type of Nipple: Everted at rest and after stimulation(semi flat/short shaft easily  compressible breast tissue)  Comfort (Breast/Nipple): Soft / non-tender  Hold (Positioning): Assistance needed to correctly position infant at breast and maintain latch.  LATCH Score: 8  Interventions Interventions: Breast feeding basics reviewed;Assisted with latch;Skin to skin;Breast massage;Hand express;Position options;Adjust position;Breast compression;Support pillows;DEBP  Lactation Tools Discussed/Used WIC Program: No Pump Review: Setup, frequency, and cleaning;Milk Storage Initiated by:: Marion Rosenberry B Date initiated:: 10/05/18   Consult Status Consult Status: Follow-up Date: 10/06/18 Follow-up type: In-patient    Ferne Coe [facility] 10/05/2018, 2:53 PM

## 2018-10-05 NOTE — MAU Provider Note (Signed)
Gwendolyn Wright is a 33 y.o. female presenting for SOL. She receives prenatal care at [facility]. Her dating is based on LMP. She desires waterbirth. Her consent is signed and scanned into the chart.  OB History    Gravida  1   Para      Term      Preterm      AB      Living        SAB      TAB      Ectopic      Multiple      Live Births             Past Medical History:  Diagnosis Date  . Asthma    Past Surgical History:  Procedure Laterality Date  . NO PAST SURGERIES      Family History: family history includes Anemia in her mother; Diabetes in her mother; Hypertension in her mother; Uterine cancer in her mother. Social History:  reports that she has never smoked. She has never used smokeless tobacco. She reports previous alcohol use. She reports that she does not use drugs.     Maternal Diabetes: No Genetic Screening: Normal Maternal Ultrasounds/Referrals: Normal Fetal Ultrasounds or other Referrals:  None Maternal Substance Abuse:  No Significant Maternal Medications:  None Significant Maternal Lab Results:  None Other Comments:  GBS NEGATIVE  --Category I tracing: baseline 145, moderate variability, positive accels, no decels --Toco irregular ctx q 1-6 min  Review of Systems  Constitutional: Negative for chills and fever.  Cardiovascular: Negative for chest pain.  Gastrointestinal: Positive for abdominal pain.  Musculoskeletal: Negative for back pain.  Neurological: Negative for weakness and headaches.  All other systems reviewed and are negative.  History Dilation: 6 Exam by:: Ginger Morris RRN Blood pressure 123/78, pulse (!) 123, resp. rate 20, last menstrual period 01/10/2018, SpO2 100 %. Exam Physical Exam  Nursing note and vitals reviewed. Constitutional: She is oriented to person, place, and time. She appears well-developed and well-nourished.  Cardiovascular: Normal rate, normal heart sounds and intact distal pulses.  Respiratory:  Effort normal and breath sounds normal.  GI: There is no abdominal tenderness. There is no rigidity, no rebound and no guarding.  Gravid  Musculoskeletal: Normal range of motion.  Neurological: She is alert and oriented to person, place, and time.  Skin: Skin is warm and dry.  Psychiatric: She has a normal mood and affect. Her behavior is normal. Judgment and thought content normal.   Patient coping well with focused breathing and encouragement during contractions  Prenatal labs: ABO, Rh: A/Positive/-- (09/23 1648) Antibody: Negative (09/23 1648) Rubella: 5.04 (09/23 1648) RPR: Non Reactive (12/17 1010)  HBsAg: Negative (09/23 1648)  HIV: Non Reactive (12/17 1010)  GBS:   NEG  Assessment/Plan: --33 y.o. G1P0 at [redacted]w[redacted]d presenting with SOL --Category I tracing --GBS NEGATIVE --Admit to Labor and Delivery, patient to labor in water, desires waterbirth   Boy (inpt circ), breast, does not desire contraception   Darlina Rumpf, CNM 10/05/2018, 6:42 AM

## 2018-10-05 NOTE — H&P (Signed)
   Gwendolyn Wright is a 33 y.o. female presenting for SOL. She receives prenatal care at [facility]. Her dating is based on LMP. She desires waterbirth. Her consent is signed and scanned into the chart.          OB History    Gravida  1   Para      Term      Preterm      AB      Living        SAB      TAB      Ectopic      Multiple      Live Births                 Past Medical History:  Diagnosis Date  . Asthma         Past Surgical History:  Procedure Laterality Date  . NO PAST SURGERIES      Family History: family history includes Anemia in her mother; Diabetes in her mother; Hypertension in her mother; Uterine cancer in her mother. Social History:  reports that she has never smoked. She has never used smokeless tobacco. She reports previous alcohol use. She reports that she does not use drugs.     Maternal Diabetes: No Genetic Screening: Normal Maternal Ultrasounds/Referrals: Normal Fetal Ultrasounds or other Referrals:  None Maternal Substance Abuse:  No Significant Maternal Medications:  None Significant Maternal Lab Results:  None Other Comments:  GBS NEGATIVE  --Category I tracing: baseline 145, moderate variability, positive accels, no decels --Toco irregular ctx q 1-6 min  Review of Systems  Constitutional: Negative for chills and fever.  Cardiovascular: Negative for chest pain.  Gastrointestinal: Positive for abdominal pain.  Musculoskeletal: Negative for back pain.  Neurological: Negative for weakness and headaches.  All other systems reviewed and are negative.  History Dilation: 6 Exam by:: Ginger Morris RRN Blood pressure 123/78, pulse (!) 123, resp. rate 20, last menstrual period 01/10/2018, SpO2 100 %. Exam Physical Exam  Nursing note and vitals reviewed. Constitutional: She is oriented to person, place, and time. She appears well-developed and well-nourished.  Cardiovascular: Normal rate, normal heart sounds  and intact distal pulses.  Respiratory: Effort normal and breath sounds normal.  GI: There is no abdominal tenderness. There is no rigidity, no rebound and no guarding.  Gravid  Musculoskeletal: Normal range of motion.  Neurological: She is alert and oriented to person, place, and time.  Skin: Skin is warm and dry.  Psychiatric: She has a normal mood and affect. Her behavior is normal. Judgment and thought content normal.   Patient coping well with focused breathing and encouragement during contractions  Prenatal labs: ABO, Rh: A/Positive/-- (09/23 1648) Antibody: Negative (09/23 1648) Rubella: 5.04 (09/23 1648) RPR: Non Reactive (12/17 1010)  HBsAg: Negative (09/23 1648)  HIV: Non Reactive (12/17 1010)  GBS:   NEG  Assessment/Plan: --33 y.o. G1P0 at [redacted]w[redacted]d presenting with SOL --Category I tracing --GBS NEGATIVE --Admit to Labor and Delivery, patient to labor in water, desires waterbirth   Boy (inpt circ), breast, does not desire contraception   Darlina Rumpf, CNM 10/05/2018, 7:42 AM

## 2018-10-05 NOTE — Discharge Summary (Signed)
Postpartum Discharge Summary     Patient Name: Gwendolyn Wright DOB: 05-Oct-1985 MRN: [id number]  Date of admission: 10/05/2018 Delivering Provider: Darlina Rumpf   Date of discharge: 10/07/2018  Admitting diagnosis: ctx Intrauterine pregnancy: [redacted]w[redacted]d     Secondary diagnosis:  Active Problems:   Indication for care in labor and delivery, antepartum   SVD (spontaneous vaginal delivery)  Additional problems: None     Discharge diagnosis: Term Pregnancy Delivered and waterbirth                                                                                                Post partum procedures:N/A  Augmentation: AROM  Complications: None  Hospital course:  Onset of Labor With Vaginal Delivery     33 y.o. yo G1P0 at [redacted]w[redacted]d was admitted in Active Labor on 10/05/2018. Patient had an uncomplicated labor course as follows:  Membrane Rupture Time/Date: 8:19 AM ,10/05/2018   Intrapartum Procedures: Episiotomy: None [1]                                         Lacerations:  1st degree [2]  Patient had a delivery of a Viable infant. 10/05/2018  Information for the patient's newborn:  Nour, Rodrigues [[id number]]  Delivery Method: Vag-Spont    Pateint had an uncomplicated postpartum course.  She is ambulating, tolerating a regular diet, passing flatus, and urinating well. Patient is discharged home in stable condition on 10/07/18.   Magnesium Sulfate recieved: No BMZ received: No  Physical exam  Vitals:   10/06/18 0518 10/06/18 1503 10/06/18 2243 10/07/18 0533  BP: 105/75 107/74 111/81 102/62  Pulse: 80 91 93 89  Resp: 14 16 18 18   Temp: 97.9 F (36.6 C) 98.2 F (36.8 C) 98.5 F (36.9 C) 98.3 F (36.8 C)  TempSrc: Oral Oral  Oral  SpO2: 100%  100% 99%  Weight:      Height:       General: alert, cooperative and no distress Lochia: appropriate Uterine Fundus: firm Incision: N/A DVT Evaluation: No evidence of DVT seen on physical exam. Labs: Lab Results  Component  Value Date   WBC 14.8 (H) 10/05/2018   HGB 14.1 10/05/2018   HCT 41.9 10/05/2018   MCV 95.2 10/05/2018   PLT 142 (L) 10/05/2018   No flowsheet data found.  Discharge instruction: per After Visit Summary and "Baby and Me Booklet".  After visit meds:  Allergies as of 10/07/2018      Reactions   Eggs Or Egg-derived Products Anaphylaxis   Patient reports she has switch to eating brown eggs instead of white that have been well cooked and have no reaction.   Peanut-containing Drug Products Anaphylaxis      Medication List    TAKE these medications   COMFORT FIT MATERNITY SUPP SM Misc Wear as directed.   ferrous sulfate 325 (65 FE) MG tablet Take 1 tablet (325 mg total) by mouth 2 (two) times daily with a meal.   VITAFOL-NANO 18-0.6-0.4 MG  Tabs Take 1 tablet by mouth daily before breakfast.       Diet: routine diet  Activity: Advance as tolerated. Pelvic rest for 6 weeks.   Outpatient follow up:4 weeks Follow up Appt: No future appointments. Follow up Visit: [place]. Schedule an appointment as soon as possible for a visit in 4 day(s).   Specialty:  Obstetrics and Gynecology Why:  Make appointment to be seen in 4 weeks for postpartum appointment  Contact information: [address], [place] [phone]         Please schedule this patient for Postpartum visit in: 6 weeks with the following provider: Any provider For C/S patients schedule nurse incision check in weeks 2 weeks: no Low risk pregnancy complicated by: no complications Delivery mode:  SVD Anticipated Birth Control:  patient does not desire contraception PP Procedures needed: none  Schedule Integrated BH visit: no   Newborn Data: Live born female  Birth Weight: 5 lb 14.7 oz (2685 g) APGAR: ,   Newborn Delivery   Birth date/time:  10/05/2018 08:53:00 Delivery type:  Vaginal, Spontaneous     Baby  Feeding: Breast Disposition:home with mother  Lajean Manes, CNM 10/07/18, 8:23 AM

## 2018-10-05 NOTE — Lactation Note (Signed)
This note was copied from a baby's chart. Lactation Consultation Note  Patient Name: Gwendolyn Wright [id number] Date: 10/05/2018  g1p1 vag delivery water birth.  [facility] Infant sts on mom and mom is trying to eat lunch.  Parents report they did not take a breastfeeding class.  Parents report they feel he is bf well. Urged parents to breastfeed 8 or more times  A day and on cue.  Parents report that he ate around 10:30am.  Parents report they would like to be checked on again in an hour or so to help with breastfeeding.     Maternal Data    Feeding Feeding Type: Breast Fed  LATCH Score Latch: Grasps breast easily, tongue down, lips flanged, rhythmical sucking.  Audible Swallowing: A few with stimulation  Type of Nipple: Flat  Comfort (Breast/Nipple): Soft / non-tender  Hold (Positioning): Assistance needed to correctly position infant at breast and maintain latch.  LATCH Score: 7  Interventions Interventions: Skin to skin;Breast feeding basics reviewed;Breast compression;Hand express  Lactation Tools Discussed/Used     Consult Status      Harrol Novello Thompson Caul 10/05/2018, 12:38 PM

## 2018-10-05 NOTE — Progress Notes (Signed)
INitial visit with Kylynn and FOB to introduce spiritual care services and offer support after the delivery of her baby.  The family was the first to deliver in the new facility and shared that they were glad to be able to have a water birth and felt they had an easy delivery.  This is their first baby.    Please page as further needs arise.  Donald Prose. Elyn Peers, M.Div. [facility] Chaplain Pager [phone] Office [phone]

## 2018-10-06 NOTE — Progress Notes (Addendum)
POSTPARTUM PROGRESS NOTE  Post Partum Day 1 Subjective:  Gwendolyn Wright is a 33 y.o. G1P1001 [redacted]w[redacted]d s/p SVD.  No acute events overnight.  Pt denies problems with ambulating, voiding or po intake.  She denies nausea or vomiting.  Pain is moderately controlled.  She has had flatus. She has not had bowel movement.  Lochia Small.   Objective: Blood pressure 105/75, pulse 80, temperature 97.9 F (36.6 C), temperature source Oral, resp. rate 14, height 5' 5.5" (1.664 m), weight 70.3 kg, last menstrual period 01/10/2018, SpO2 100 %, unknown if currently breastfeeding.  Physical Exam:  General: alert, cooperative and no distress Lochia:normal flow Chest: no respiratory distress Heart: regular rate, distal pulses intact Abdomen: soft, nontender,  Uterine Fundus: firm, appropriately tender DVT Evaluation: No calf swelling or tenderness Extremities: no edema  Recent Labs    10/05/18 0623  HGB 14.1  HCT 41.9    Assessment/Plan:  ASSESSMENT: Gwendolyn Wright is a 33 y.o. G1P1001 [redacted]w[redacted]d s/p SVD  Plan for discharge tomorrow, Discharge home, Lactation consult and Circumcision prior to discharge   LOS: 1 day   Timothy LockamyMD 10/06/2018, 7:57 AM

## 2018-10-07 MED ORDER — TETANUS-DIPHTH-ACELL PERTUSSIS 5-2.5-18.5 LF-MCG/0.5 IM SUSP
0.5000 mL | Freq: Once | INTRAMUSCULAR | Status: AC
  Administered 2018-10-07: 0.5 mL via INTRAMUSCULAR
  Filled 2018-10-07: qty 0.5

## 2018-10-07 NOTE — Lactation Note (Signed)
This note was copied from a baby's chart. Lactation Consultation Note  Patient Name: Gwendolyn Wright [id number] Date: 10/07/2018 Reason for consult: Follow-up assessment;Early term 37-38.6wks;[place] Baby is 41 hours old and at a 7% weight loss.  Mom has independently positioned baby in football hold.  Baby latched well and actively feeding.  Reviewed basics and answered questions.  Discussed milk coming to volume and the prevention and treatment of engorgement.  Mom has a DEBP at home.  Lactation outpatient services and support information reviewed and encouraged prn.  Maternal Data    Feeding Feeding Type: Breast Fed  LATCH Score Latch: Grasps breast easily, tongue down, lips flanged, rhythmical sucking.  Audible Swallowing: A few with stimulation  Type of Nipple: Everted at rest and after stimulation  Comfort (Breast/Nipple): Soft / non-tender  Hold (Positioning): No assistance needed to correctly position infant at breast.  LATCH Score: 9  Interventions    Lactation Tools Discussed/Used     Consult Status Consult Status: Complete Follow-up type: Call as needed    Ave Filter 10/07/2018, 10:07 AM

## 2018-10-08 ENCOUNTER — Encounter: Payer: Managed Care, Other (non HMO) | Admitting: Nurse Practitioner

## 2018-11-06 ENCOUNTER — Other Ambulatory Visit: Payer: Self-pay

## 2018-11-06 ENCOUNTER — Encounter: Payer: Self-pay | Admitting: Certified Nurse Midwife

## 2018-11-06 ENCOUNTER — Ambulatory Visit (INDEPENDENT_AMBULATORY_CARE_PROVIDER_SITE_OTHER): Payer: Managed Care, Other (non HMO) | Admitting: Certified Nurse Midwife

## 2018-11-06 NOTE — Patient Instructions (Signed)

## 2018-11-06 NOTE — Progress Notes (Signed)
Post Partum Exam  Gwendolyn Wright is a 33 y.o. G84P1001 female who presents for a postpartum visit. She is 4 weeks postpartum following a spontaneous vaginal delivery. I have fully reviewed the prenatal and intrapartum course. The delivery was at 28 gestational weeks.  Anesthesia: None. Postpartum course has been unremarkable. Baby's course has been unremarkable. Baby is feeding by breast. Bleeding staining only. Bowel function is normal. Bladder function is normal. Patient is not sexually active. Contraception method is none. Postpartum depression screening:neg  The following portions of the patient's history were reviewed and updated as appropriate: allergies, current medications, past medical history, past social history and problem list. Last pap smear done 04/2018 and was Normal  Review of Systems A comprehensive review of systems was negative.    Objective:  Last menstrual period 01/10/2018, unknown if currently breastfeeding.  General:  alert, cooperative and no distress   Breasts:  inspection negative, no nipple discharge or bleeding, no masses or nodularity palpable  Lungs: clear to auscultation bilaterally  Heart:  regular rate and rhythm and S1, S2 normal  Abdomen: soft, non-tender; bowel sounds normal; no masses,  no organomegaly   Vulva:  not evaluated  Vagina: not evaluated  Cervix:  not evaluated  Corpus: not examined  Adnexa:  not evaluated  Rectal Exam: Not performed.        Assessment/Plan:  1. Postpartum care and examination - Normal postpartum exam. Pap smear not done at today's visit.  - educated and discussed birth control options in detail, patient does not want to be on birth control at this time  - discussed use of NFP and ovulation tracking, app downloading with patient to track ovulation and to know when not to have intercourse  - Follow up in 1 year for annual examination or as needed   Contraception: Natural family Gwendolyn Wright,  [name] 11/06/18, 4:02 PM

## 2018-11-26 ENCOUNTER — Other Ambulatory Visit: Payer: Self-pay

## 2018-11-26 ENCOUNTER — Ambulatory Visit: Payer: Managed Care, Other (non HMO) | Admitting: Allergy

## 2018-11-26 ENCOUNTER — Encounter: Payer: Self-pay | Admitting: Allergy

## 2018-11-26 VITALS — BP 108/70 | HR 109 | Temp 98.5°F | Resp 16 | Ht 65.75 in | Wt 145.4 lb

## 2018-11-26 DIAGNOSIS — T781XXD Other adverse food reactions, not elsewhere classified, subsequent encounter: Secondary | ICD-10-CM

## 2018-11-26 DIAGNOSIS — J4520 Mild intermittent asthma, uncomplicated: Secondary | ICD-10-CM | POA: Diagnosis not present

## 2018-11-26 DIAGNOSIS — T781XXA Other adverse food reactions, not elsewhere classified, initial encounter: Secondary | ICD-10-CM | POA: Diagnosis not present

## 2018-11-26 DIAGNOSIS — J3089 Other allergic rhinitis: Secondary | ICD-10-CM | POA: Diagnosis not present

## 2018-11-26 DIAGNOSIS — H1013 Acute atopic conjunctivitis, bilateral: Secondary | ICD-10-CM

## 2018-11-26 NOTE — Progress Notes (Signed)
New Patient Note  RE: Gwendolyn Wright MRN: [id number] DOB: 1986/06/06 Date of Office Visit: 11/26/2018  Referring provider: No ref. provider found  Chief Complaint: food allergy  History of present illness: Gwendolyn Wright is a 33 y.o. female presenting today for evaluation of food allergy.  Patient states her OB, Dr. Jodi Mourning, is acting as her PCP at this time.   She had a baby recently in February and states since delivery she has been having more issues with certain types of foods.   She does have a food allergy history with peanut allergy since childhood.    She state she ate pineapple throughout pregnancy but got a different brand (Dole pineapple chunks) which caused throat itch, mouth burning and her mouth and lips turned a dark purple color and also noted mild lip swelling.  The mouth discoloration is improving but she still has some darkness to the mucosa.  She switched over to a different pineapple brand and tolerated it better.  She noted with Cheeze-its which contains wheat that it too caused her throat to itch.  She also notes with sandwiches that she has had throat itch and mouth burning sensations.  Smoothies (made with different fruits including banana and oranges) also has caused her mouth to itch and burn.   She states plain strawberry smoothie and peach smoothie didn't cause symptoms. If she eats ripe bananas will have oral symptoms but states she is able to eat banana bread without issue. Uncooked spinach cause oral issues as well but able to tolerate if cooked/heated.  She states eggs make her mouth itch but it depends on how she cooks it.  She states she has had symptoms with quiche and she does not really eat scrambled egg or boiled eggs.  She states when she was younger scrambled eggs would make her mouth that she has been avoiding them for quite some time.  She is able to eat baked egg products however.  She states cant eat watermelon and canteloupe due to development of oral  symptoms. Peanut ingestion she develops "swelling of the throat".  She states she has been avoiding peanut products since elementary school.  She does have an epipen.    She moved here in September 2019 from [place].   She states she has not had any significant symptoms since being here but endorses significant symptoms in [place].   Previously would have itchy/watery eyes, sneezing, and nasal congestion/drainage.  She does recall having skin testing around 34 yo and was positive to molds, trees, grasses, pets that she can remember.  She states she never had food testing done back then.   Eczema as a child no longer an issue in adulthood. She has asthma diagnosed in childhood.  She still does have an albuterol inhaler but states she has not had to use it since moving here.  She may use with exercise.  She states it has been many many years since she has required consistent use of albuterol.  She denies requiring any systemic steroids.  Denies nighttime awakenings.  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  Review of systems: Review of Systems  Constitutional: Negative for chills, fever and malaise/fatigue.  HENT: Negative for congestion, ear discharge, ear pain, nosebleeds and sore throat.   Eyes: Negative for pain, discharge and redness.  Respiratory: Negative for cough, shortness of breath and wheezing.   Cardiovascular: Negative for chest pain.  Gastrointestinal: Negative for abdominal pain, constipation, diarrhea, heartburn, nausea and vomiting.  Musculoskeletal: Negative for  joint pain.  Skin: Negative for itching and rash.  Neurological: Negative for headaches.    All other systems negative unless noted above in HPI  Past medical history: Past Medical History:  Diagnosis Date   Asthma    Eczema     Past surgical history: Past Surgical History:  Procedure Laterality Date   NO PAST SURGERIES      Family history:  Family History  Problem Relation Age of Onset   Uterine cancer Mother     Hypertension Mother    Diabetes Mother    Anemia Mother    Diabetes Father     Social history: She lives in a home with carpeting in the bedroom with gas heating and central cooling.  There are no pets in the home.  There is no concern for water damage, mildew or roaches in the home at this time.  Currently not working.  Denies a smoking history.  Medication List: Allergies as of 11/26/2018      Reactions   Eggs Or Egg-derived Products Anaphylaxis   Patient reports she has switch to eating brown eggs instead of white that have been well cooked and have no reaction.   Peanut-containing Drug Products Anaphylaxis      Medication List       Accurate as of November 26, 2018  1:48 PM. Always use your most recent med list.        ferrous sulfate 325 (65 FE) MG tablet Take 1 tablet (325 mg total) by mouth 2 (two) times daily with a meal.   PRO-BIOTIC BLEND PO Take by mouth.   PROAIR HFA IN Inhale into the lungs.   Vitafol-Nano 18-0.6-0.4 MG Tabs Take 1 tablet by mouth daily before breakfast.       Known medication allergies: Allergies  Allergen Reactions   Eggs Or Egg-Derived Products Anaphylaxis    Patient reports she has switch to eating brown eggs instead of white that have been well cooked and have no reaction.   Peanut-Containing Drug Products Anaphylaxis     Physical examination: Blood pressure 108/70, pulse (!) 109, temperature 98.5 F (36.9 C), temperature source Oral, resp. rate 16, height 5' 5.75" (1.67 m), weight 145 lb 6.4 oz (66 kg), SpO2 98 %, unknown if currently breastfeeding.  General: Alert, interactive, in no acute distress. HEENT: PERRLA, TMs pearly gray, turbinates non-edematous without discharge, post-pharynx non erythematous, buccal mucosa is hyperpigmented Neck: Supple without lymphadenopathy. Lungs: Clear to auscultation without wheezing, rhonchi or rales. {no increased work of breathing. CV: Normal S1, S2 without murmurs. Abdomen:  Nondistended, nontender. Skin: Warm and dry, without lesions or rashes. Extremities:  No clubbing, cyanosis or edema. Neuro:   Grossly intact.  Diagnositics/Labs:  Allergy testing: Environmental allergy skin prick testing is positive to grasses, weeds, trees, molds, dust mites, cat, horse. Select food allergy skin prick testing is positive to peanut, egg, coconut, lobster and cantaloupe. Allergy testing results were read and interpreted by provider, documented by clinical staff.   Assessment and plan:   Adverse food reaction   - select food allergy skin testing is positive to peanut, egg, coconut, lobster, canteloupe  - continue avoidance of peanut positive, stove-top egg, coconut, shellfish (crustaceans) and canteloupe  - have access to self-injectable epinephrine (Epipen or AuviQ) 0.3mg  at all times  - follow emergency action plan in case of allergic reaction  -Advised to discuss with dentist regarding the mucosal hyperpigmentation.  Most likely related to the acidity of the pineapple she was eating.  It is improving.  Allergic rhinitis with conjunctivitis  - skin testing is positive to grasses, weeds, trees, molds, dust mites, cat, horse  - allergen avoidance measures discussed/handouts provided  - for general allergy symptom relief recommend long-acting antihistamine like Xyzal 5mg  or Zyrtec 10mg  daily as needed (both safe during pregnacy/nursing  - for nasal congestion/drainage can use OTC Rhinocort 2 sprays each nostril daily as needed  - for itchy/watery/red eyes can use OTC Pataday 1 drop each eye daily as needed  - allergen immunotherapy discussed today including protocol, benefits and risk.  Informational handout provided.  This can also help with OAS.   If interested in this therapuetic option you can check with your insurance carrier for coverage.  Let us know if you would like to proceed with this option.    Oral allergy syndrome  - The oral allergy syndrome (OAS) or  pollen-food allergy syndrome (PFAS) is a relatively common form of food allergy, particularly in adults. It typically occurs in people who have pollen allergies when the immune system "sees" proteins on the food that look like proteins on the pollen. This results in the allergy antibody (IgE) binding to the food instead of the pollen. Patients typically report itching and/or mild swelling of the mouth and throat immediately following ingestion of certain uncooked fruits (including nuts) or raw vegetables. Only a very small number of affected individuals experience systemic allergic reactions, such as anaphylaxis which occurs with true food allergies.    Asthma  - mild, well-controlled  - have access to albuterol inhaler 2 puffs every 4-6 hours as needed for cough/wheeze/shortness of breath/chest tightness.  May use 15-20 minutes prior to activity.   Monitor frequency of use.    Follow-up 6 months or sooner if needed   I appreciate the opportunity to take part in [facility] care. Please do not hesitate to contact me with questions.  Sincerely,   Prudy Feeler, MD Allergy/Immunology [facility]

## 2018-11-26 NOTE — Patient Instructions (Addendum)
Adverse food reaction   - select food allergy skin testing is positive to peanut, egg, coconut, lobster, canteloupe  - continue avoidance of peanut positive, stove-top egg, coconut, shellfish (crustaceans) and canteloupe  - have access to self-injectable epinephrine (Epipen or AuviQ) 0.3mg  at all times  - follow emergency action plan in case of allergic reaction  Environmental allergy  - skin testing is positive to grasses, weeds, trees, molds, dust mites, cat, horse  - allergen avoidance measures discussed/handouts provided  - for general allergy symptom relief recommend long-acting antihistamine like Xyzal 5mg  or Zyrtec 10mg  daily as needed (both safe during pregnacy/nursing  - for nasal congestion/drainage can use OTC Rhinocort 2 sprays each nostril daily as needed  - for itchy/watery/red eyes can use OTC Pataday 1 drop each eye daily as needed  - allergen immunotherapy discussed today including protocol, benefits and risk.  Informational handout provided.  This can also help with OAS.   If interested in this therapuetic option you can check with your insurance carrier for coverage.  Let us know if you would like to proceed with this option.    Oral allergy syndrome  - The oral allergy syndrome (OAS) or pollen-food allergy syndrome (PFAS) is a relatively common form of food allergy, particularly in adults. It typically occurs in people who have pollen allergies when the immune system "sees" proteins on the food that look like proteins on the pollen. This results in the allergy antibody (IgE) binding to the food instead of the pollen. Patients typically report itching and/or mild swelling of the mouth and throat immediately following ingestion of certain uncooked fruits (including nuts) or raw vegetables. Only a very small number of affected individuals experience systemic allergic reactions, such as anaphylaxis which occurs with true food allergies.       Asthma  - mild, well-controlled  -  have access to albuterol inhaler 2 puffs every 4-6 hours as needed for cough/wheeze/shortness of breath/chest tightness.  May use 15-20 minutes prior to activity.   Monitor frequency of use.    Follow-up 6 months or sooner if needed

## 2018-11-26 NOTE — Progress Notes (Signed)
Pt thinks that she may be allergic to peanut butter:  Inside of mouth swells up Eggs:  Mouth itches inside, but tolerates eggs baked in product Wheat:  Ate cheezeits and her throat started itching. Banana:  Throat itches

## 2019-12-30 IMAGING — US US MFM OB COMP +14 WKS
1 series · 14 of 28 positions shown · non-contrast
Comparison: none

[Series 1: us mfm ob comp +14 wks · 14 of 129 slices shown]
[im 5/129]
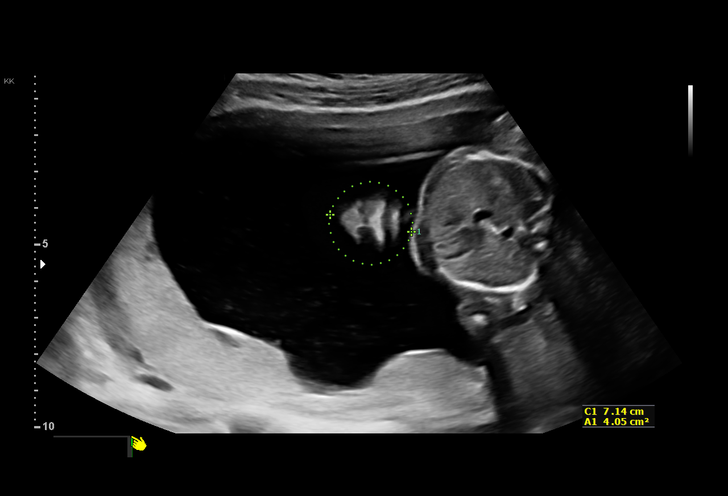
[im 15/129]
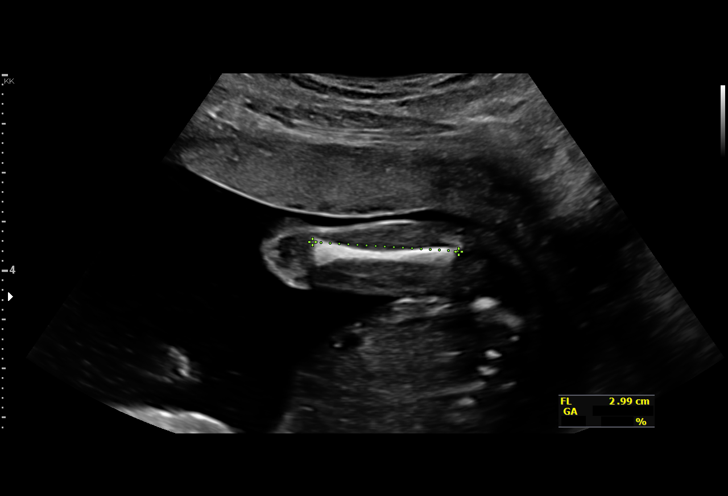
[im 24/129]
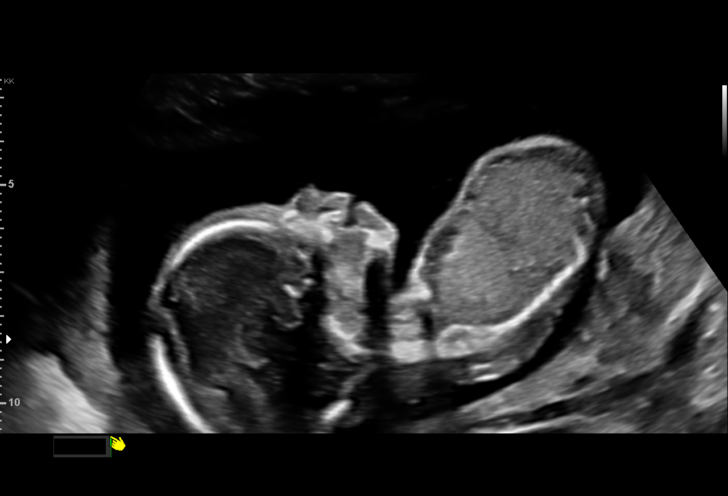
[im 34/129]
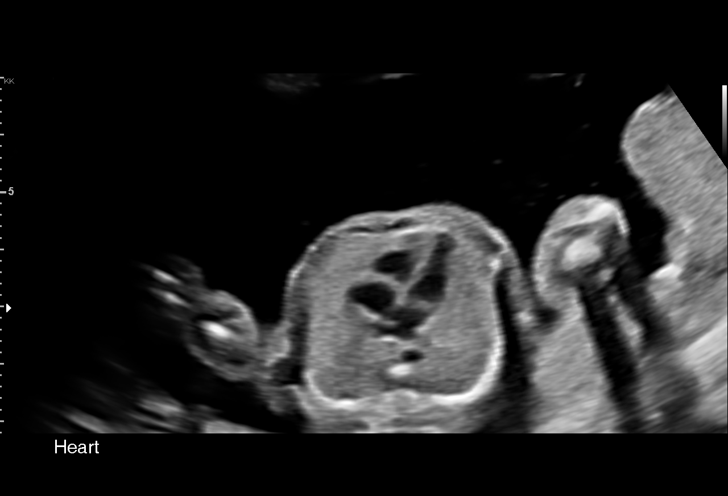
[im 43/129]
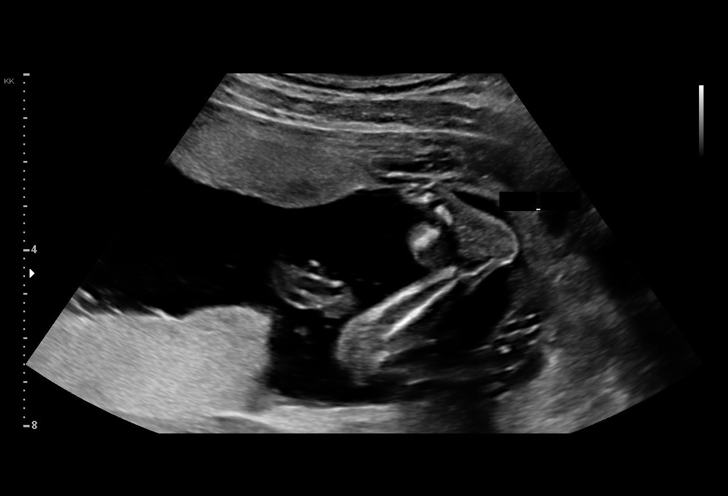
[im 53/129]
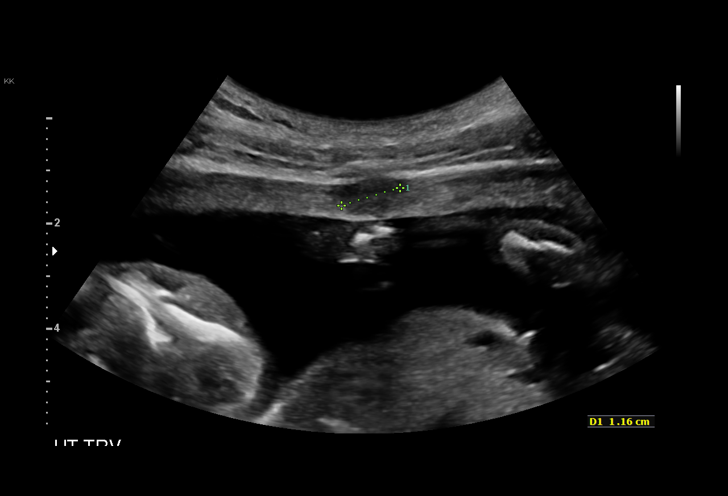
[im 62/129]
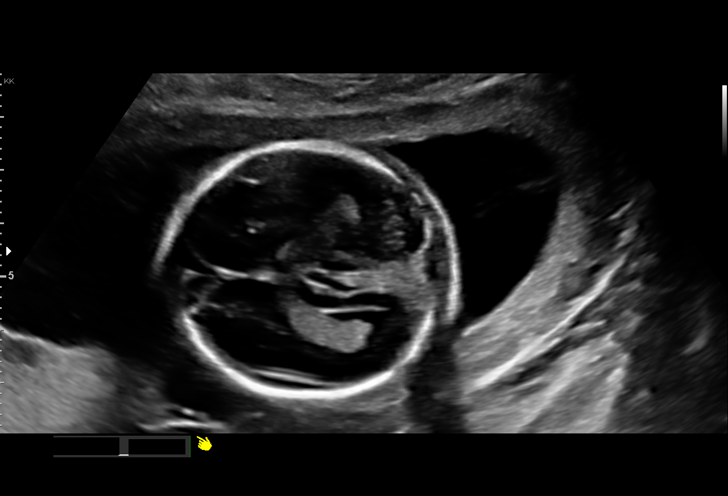
[im 72/129]
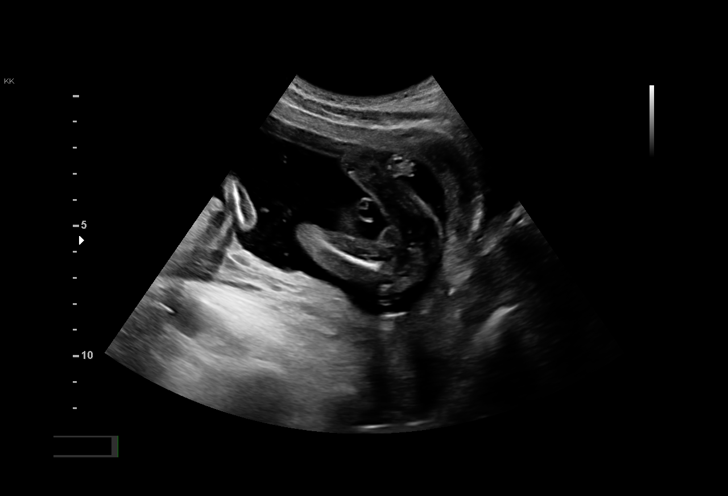
[im 81/129]
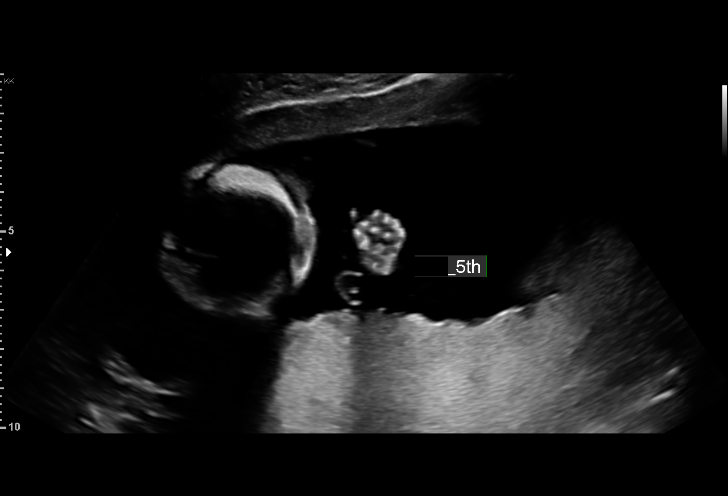
[im 91/129]
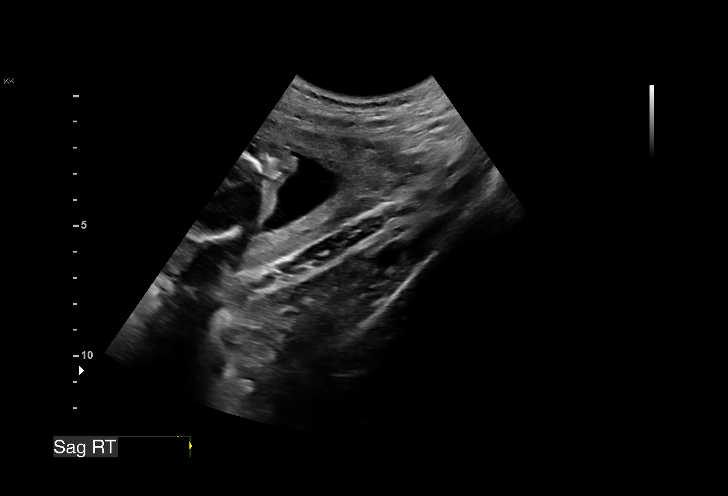
[im 100/129]
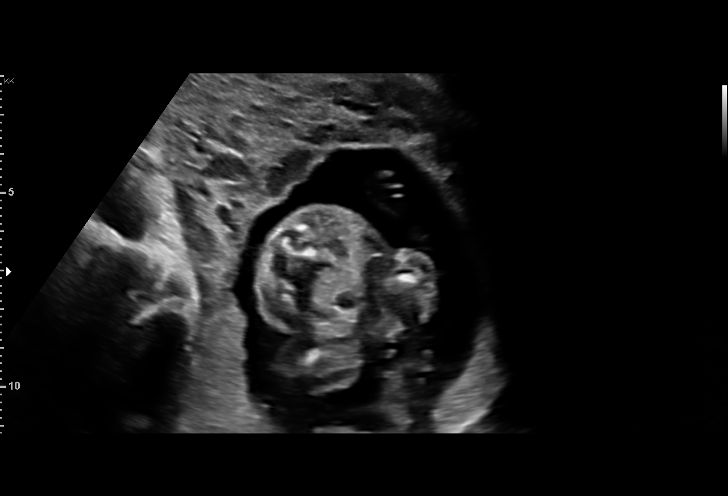
[im 110/129]
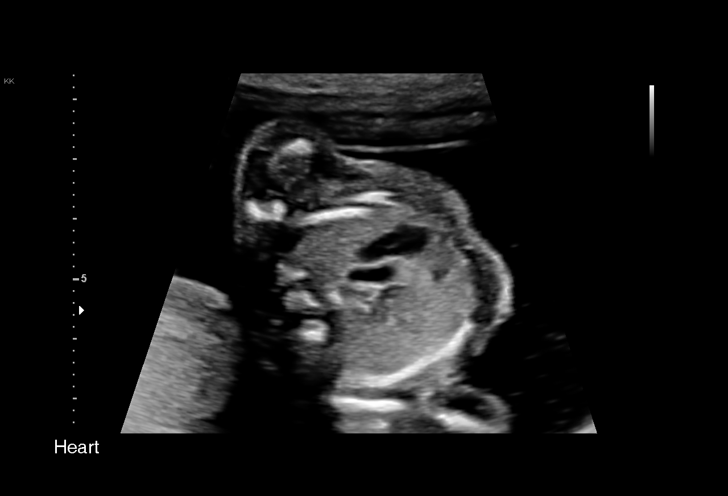
[im 119/129]
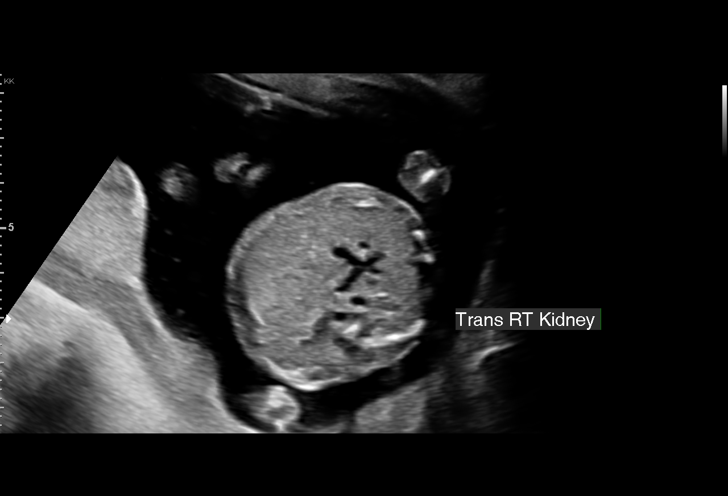
[im 129/129]
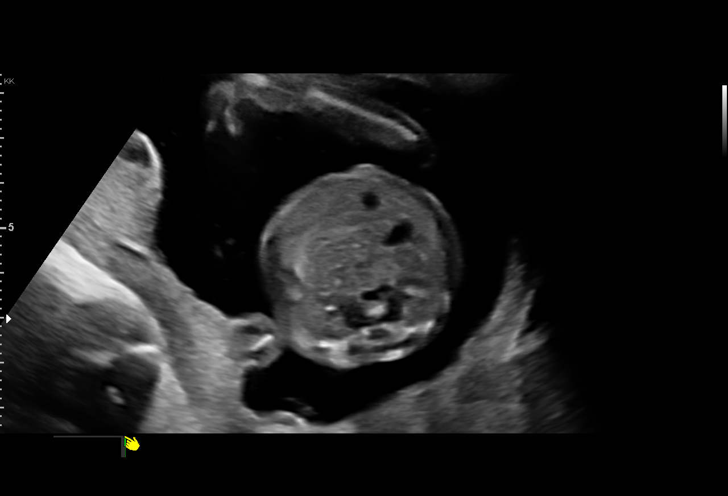

[14 of 28 positions shown; findings below may reference images not displayed]

1  US MFM OB COMP + 14 WK               76805.01     BLONDINACKA SAMSONAITE

Indications

Encounter for antenatal screening for
malformations
Uterine fibroids affecting pregnancy in        O34.12,
second trimester, antepartum
19 weeks gestation of pregnancy
Fetal Evaluation

Num Of Fetuses:         1
Fetal Heart Rate(bpm):  158
Cardiac Activity:       Observed
Presentation:           Transverse, head to maternal right
Placenta:               Posterior
P. Cord Insertion:      Visualized

Amniotic Fluid
AFI FV:      Within normal limits

Largest Pocket(cm)
4.1
Biometry

BPD:      46.5  mm     G. Age:  20w 0d         71  %    CI:        73.52   %    70 - 86
FL/HC:      17.3   %    16.8 -
HC:      172.3  mm     G. Age:  19w 6d         53  %    HC/AC:      1.19        1.09 -
AC:      145.4  mm     G. Age:  19w 6d         54  %    FL/BPD:     64.1   %
FL:       29.8  mm     G. Age:  19w 2d         30  %    FL/AC:      20.5   %    20 - 24
NFT:       4.3  mm

Est. FW:     301  gm    0 lb 11 oz      48  %
OB History

Gravidity:    1
Gestational Age

LMP:           19w 4d        Date:  01/10/18                 EDD:   10/17/18
U/S Today:     19w 5d                                        EDD:   10/16/18
Best:          19w 4d     Det. By:  LMP  (01/10/18)          EDD:   10/17/18
Anatomy

Cranium:               Appears normal         Aortic Arch:            Appears normal
Cavum:                 Appears normal         Ductal Arch:            Appears normal
Ventricles:            Appears normal         Diaphragm:              Appears normal
Choroid Plexus:        Appears normal         Stomach:                Appears normal, left
sided
Cerebellum:            Appears normal         Abdomen:                Appears normal
Posterior Fossa:       Appears normal         Abdominal Wall:         Appears nml (cord
insert, abd wall)
Nuchal Fold:           Appears normal         Cord Vessels:           Appears normal (3
vessel cord)
Face:                  Appears normal         Kidneys:                Appear normal
(orbits and profile)
Lips:                  Appears normal         Bladder:                Appears normal
Thoracic:              Appears normal         Spine:                  Not well visualized
Heart:                 Not well visualized    Upper Extremities:      Appears normal
RVOT:                  Not well visualized    Lower Extremities:      Appears normal
LVOT:                  Appears normal

Other:  Male gender. Heels and 5th digit visualized. Technically difficult due
to fetal position.
Cervix Uterus Adnexa

Cervix
Length:            3.7  cm.
Normal appearance by transabdominal scan.

Adnexa
No abnormality visualized.
Myomas

Site                     L(cm)      W(cm)      D(cm)      Location
Anterior                 1.5        1
Anterior

Blood Flow                 RI        PI       Comments

Comments

U/S images reviewed.  Appropriate fetal growth is noted.  No
fetal abnormalities are seen.
Recommendations: 1) Completion of anatomy in 4 weeks
Recommendations

1) Completion of anatomy in 4 weeks

## 2020-02-03 IMAGING — US US MFM OB FOLLOW-UP
1 series · 13 of 28 positions shown · non-contrast
Comparison: none

[Series 1: us mfm ob follow-up · 51 acquisitions, 13 frames shown]
[im 2/51]
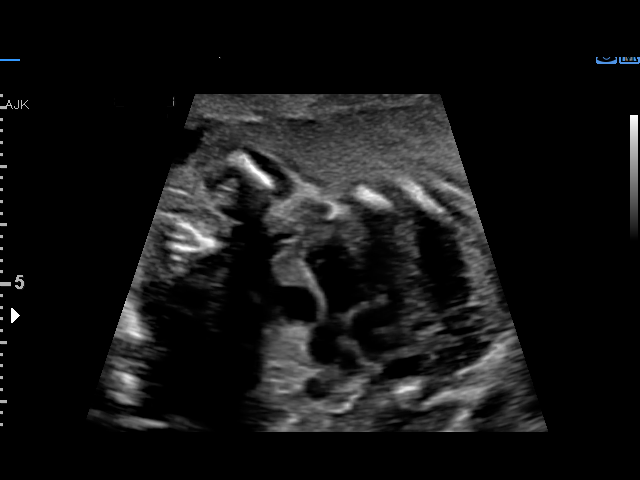
[im 6/51]
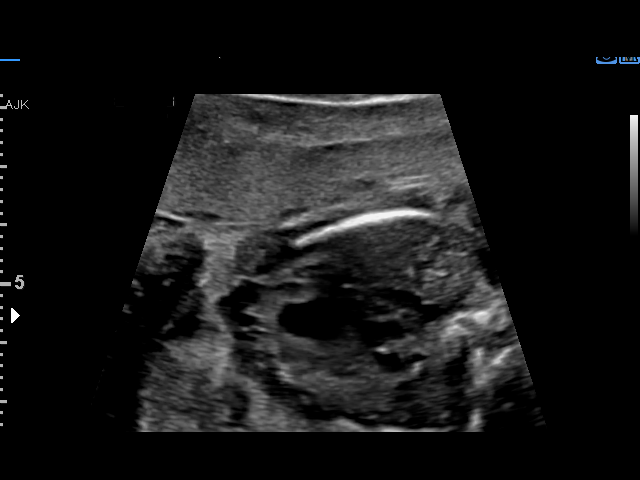
[im 10/51]
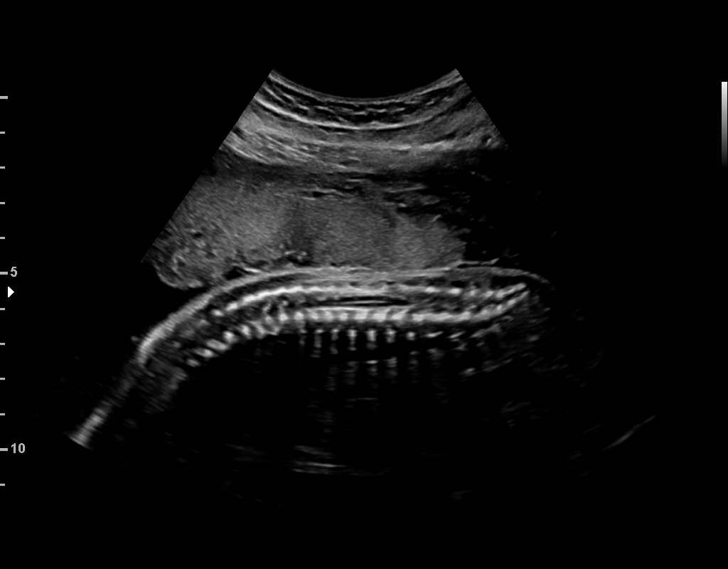
[im 13/51]
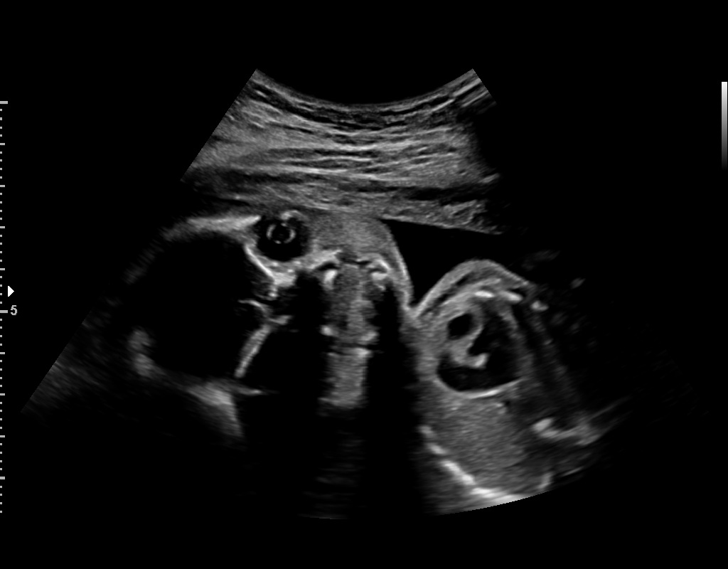
[im 17/51]
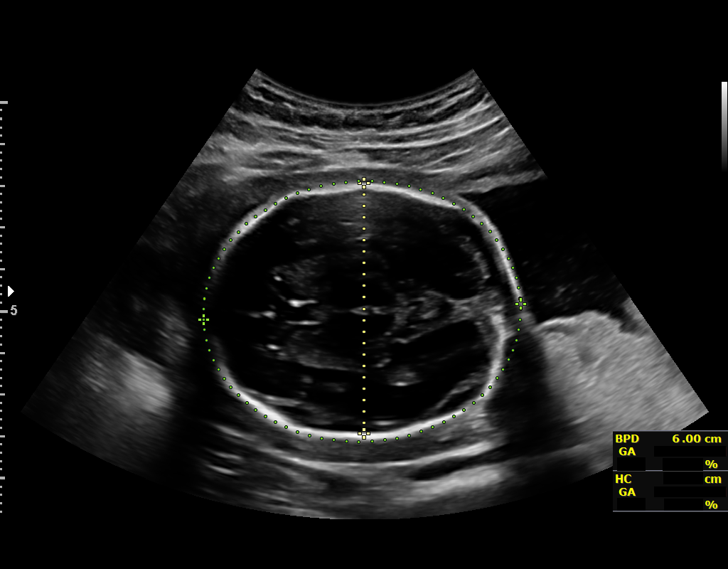
[im 21/51]
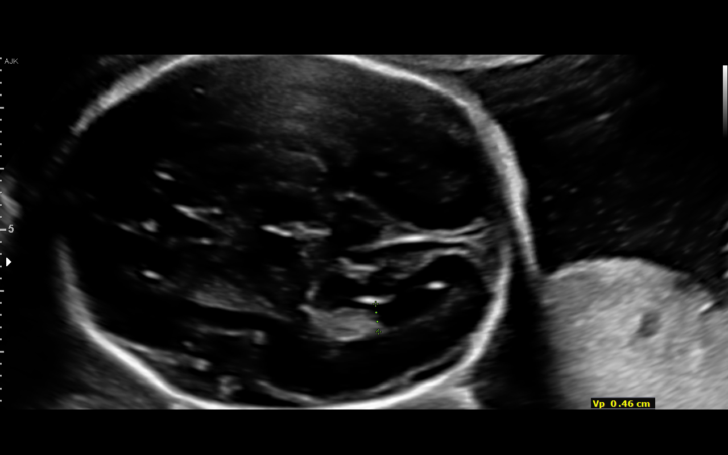
[im 26/51]
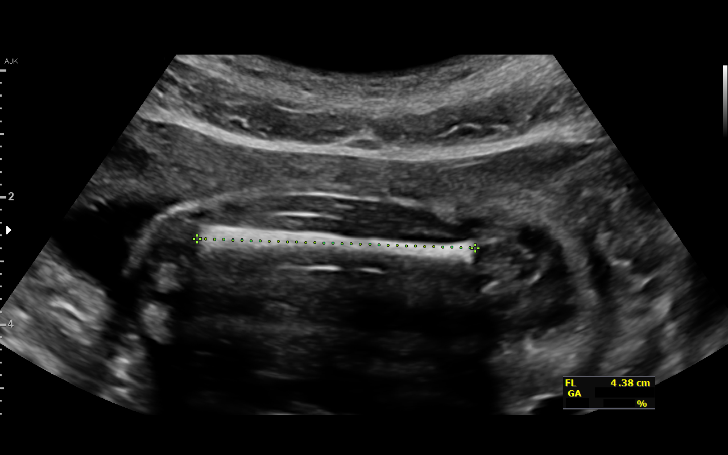
[im 30/51]
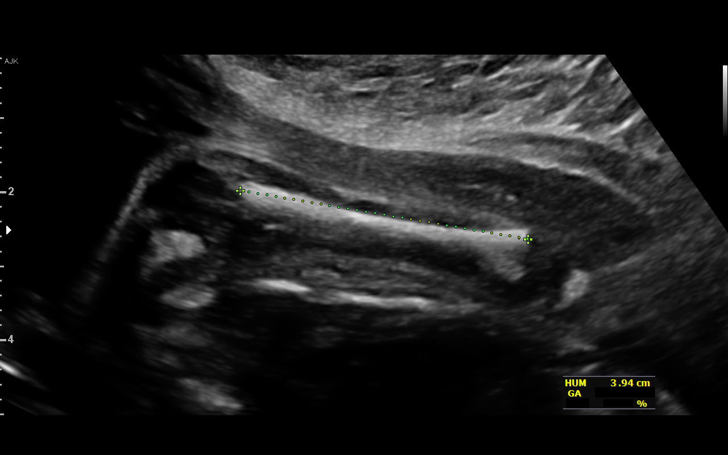
[im 34/51]
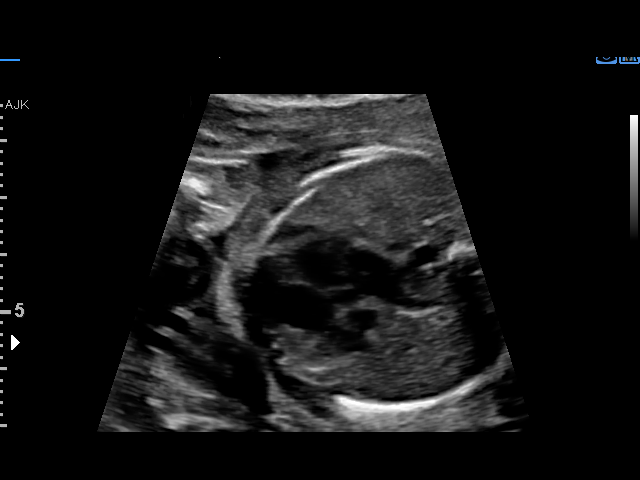
[im 38/51]
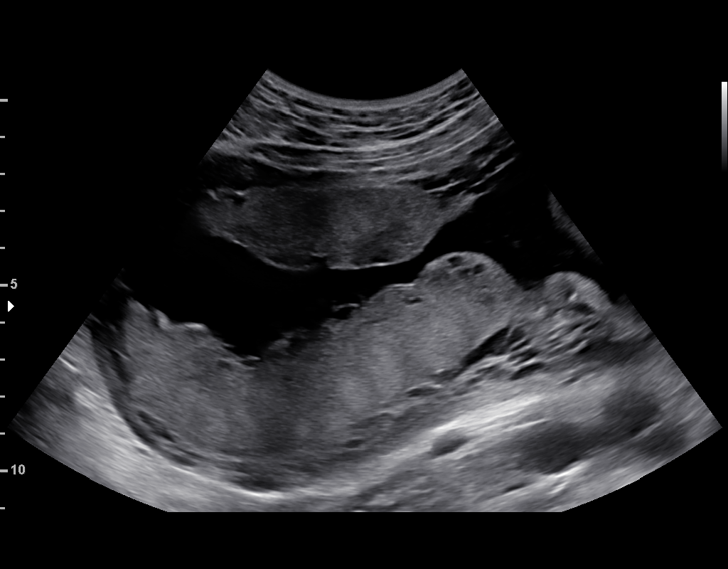
[im 41/51]
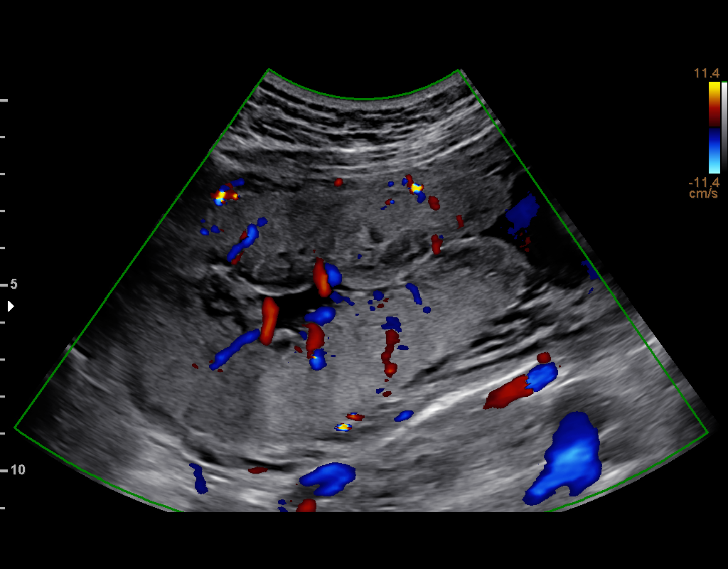
[im 45/51]
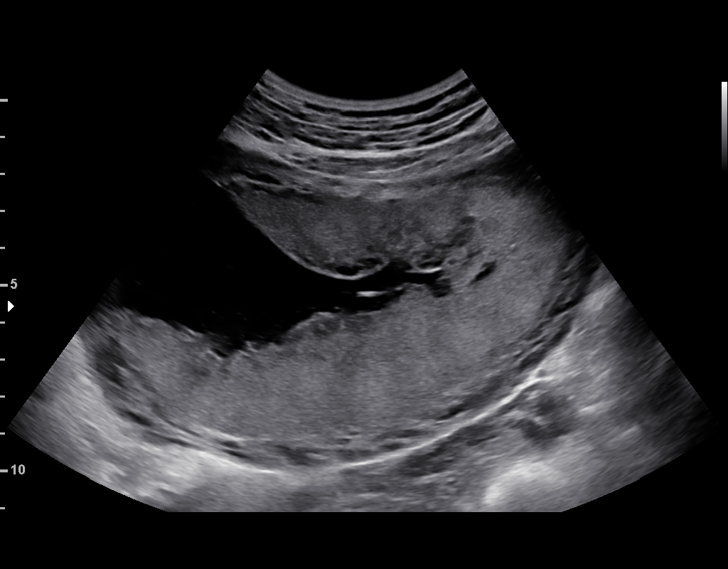
[im 49/51]
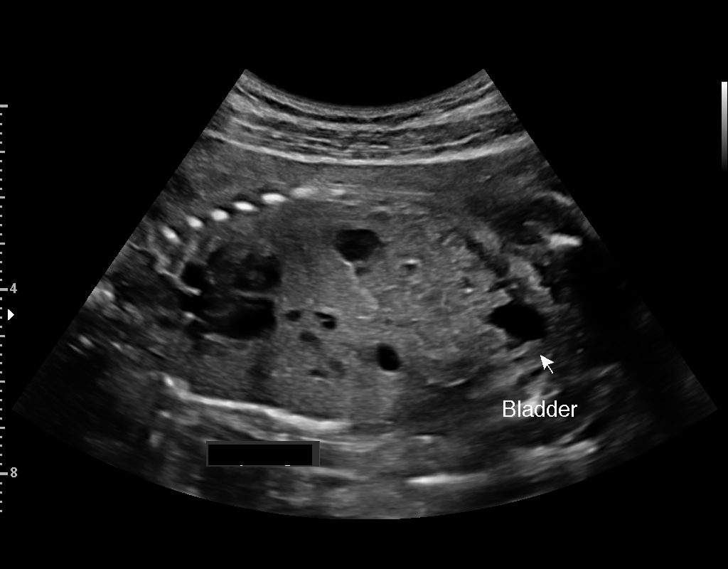

[13 of 28 positions shown; findings below may reference images not displayed]

----------------------------------------------------------------------

 ----------------------------------------------------------------------
Indications

  Uterine fibroids affecting pregnancy in        O34.12,
  second trimester, antepartum
  Encounter for other antenatal screening
  follow-up
  24 weeks gestation of pregnancy
 ----------------------------------------------------------------------
Fetal Evaluation

 Num Of Fetuses:         1
 Cardiac Activity:       Observed
 Presentation:           Breech
 Placenta:               posterior left lateral
 P. Cord Insertion:      Visualized, central

 Amniotic Fluid
 AFI FV:      Within normal limits

                             Largest Pocket(cm)

Biometry

 BPD:      60.1  mm     G. Age:  24w 4d         40  %    CI:        75.58   %    70 - 86
                                                         FL/HC:      19.9   %    18.7 -
 HC:      219.2  mm     G. Age:  23w 6d         13  %    HC/AC:      1.09        1.04 -
 AC:      200.4  mm     G. Age:  24w 4d         45  %    FL/BPD:     72.7   %    71 - 87
 FL:       43.7  mm     G. Age:  24w 3d         30  %    FL/AC:      21.8   %    20 - 24
 HUM:      39.1  mm     G. Age:  24w 0d         27  %
 LV:        4.6  mm

 Est. FW:     700  gm      1 lb 9 oz     50  %
OB History

 Gravidity:    1
Gestational Age

 LMP:           24w 4d        Date:  01/10/18                 EDD:   10/17/18
 U/S Today:     24w 3d                                        EDD:   10/18/18
 Best:          24w 4d     Det. By:  LMP  (01/10/18)          EDD:   10/17/18
Anatomy

 Cranium:               Appears normal         Aortic Arch:            Previously seen
 Cavum:                 Appears normal         Ductal Arch:            Previously seen
 Ventricles:            Appears normal         Diaphragm:              Appears normal
 Choroid Plexus:        Previously seen        Stomach:                Appears normal, left
                                                                       sided
 Cerebellum:            Previously seen        Abdomen:                Appears normal
 Posterior Fossa:       Previously seen        Abdominal Wall:         Previously seen
 Nuchal Fold:           Previously seen        Cord Vessels:           Previously seen
 Face:                  Orbits and profile     Kidneys:                Previously seen
                        previously seen
 Lips:                  Appears normal         Bladder:                Appears normal
 Thoracic:              Appears normal         Spine:                  Appears normal
 Heart:                 Appears normal         Upper Extremities:      Previously seen
                        (4CH, axis, and situs
 RVOT:                  Appears normal         Lower Extremities:      Previously seen
 LVOT:                  Appears normal

 Other:  Male gender. Heels and 5th digit previously visualized. Technically
         difficult due to fetal position.
Cervix Uterus Adnexa

 Cervix
 Length:           4.37  cm.
 Normal appearance by transabdominal scan.

 Uterus
 No abnormality visualized.

 Left Ovary
 Not visualized.

 Right Ovary
 Not visualized.

 Adnexa
 No abnormality visualized.
Impression

 Normal interval growth.
 Anatomy follow up and completion.
Recommendations

 Follow up as clinically indicated.

## 2020-03-23 IMAGING — US US MFM OB FOLLOW-UP
1 series · 14 of 28 positions shown · non-contrast
Comparison: none

[Series 1: us mfm ob follow-up · 14 of 32 slices shown]
[im 2/32]
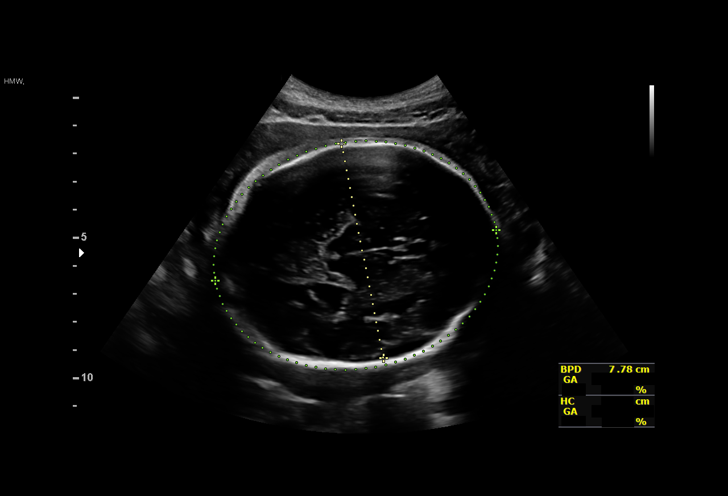
[im 4/32]
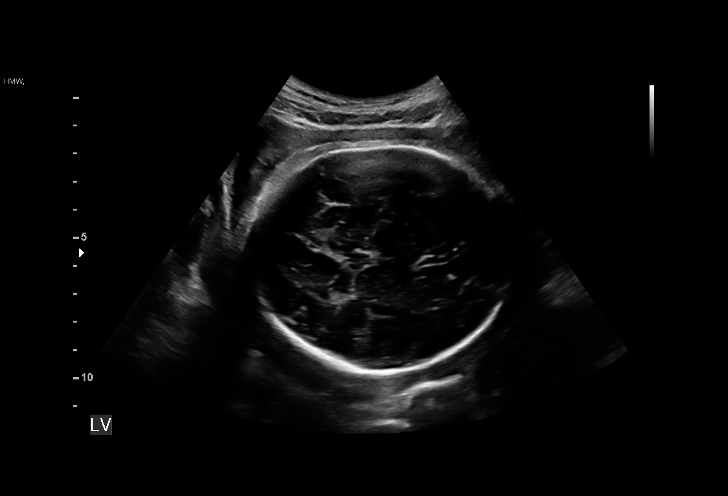
[im 6/32]
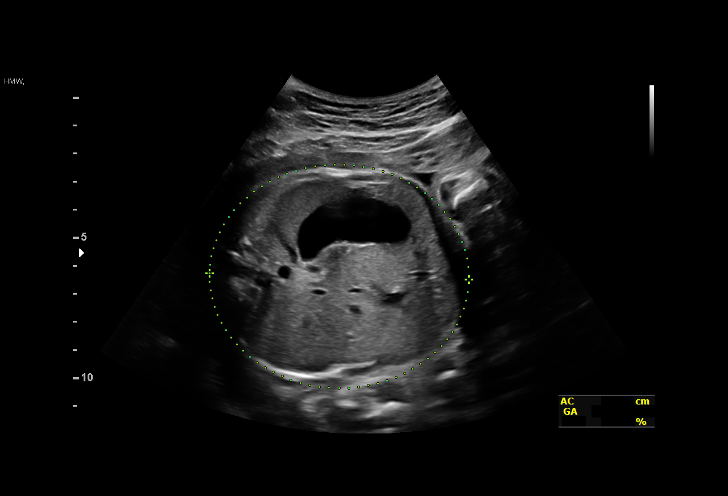
[im 9/32]
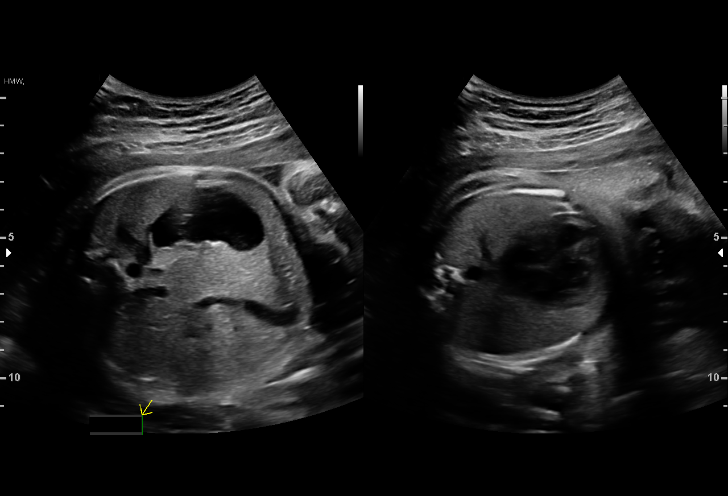
[im 11/32]
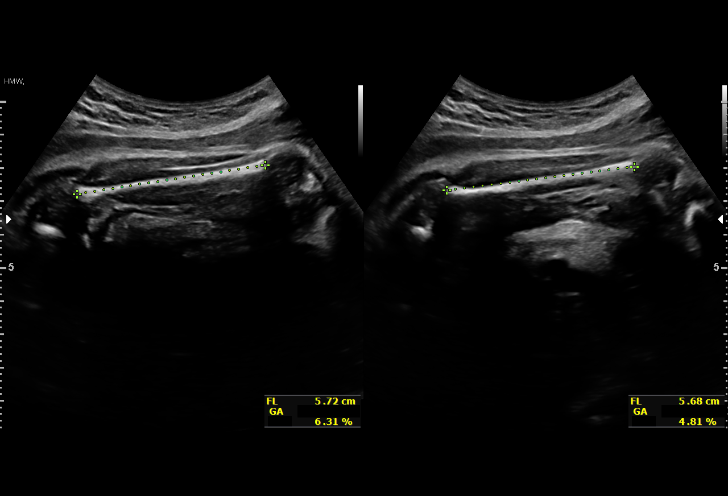
[im 13/32]
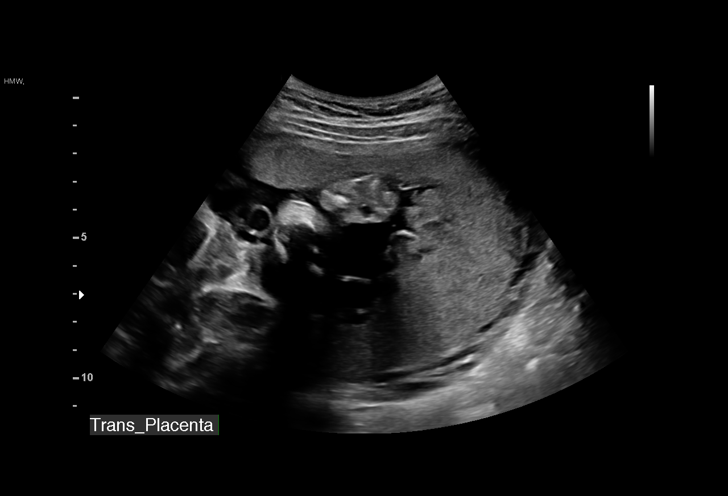
[im 15/32]
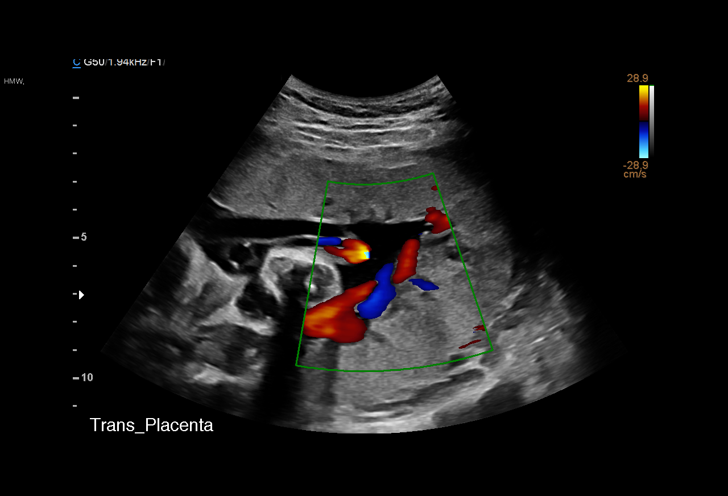
[im 18/32]
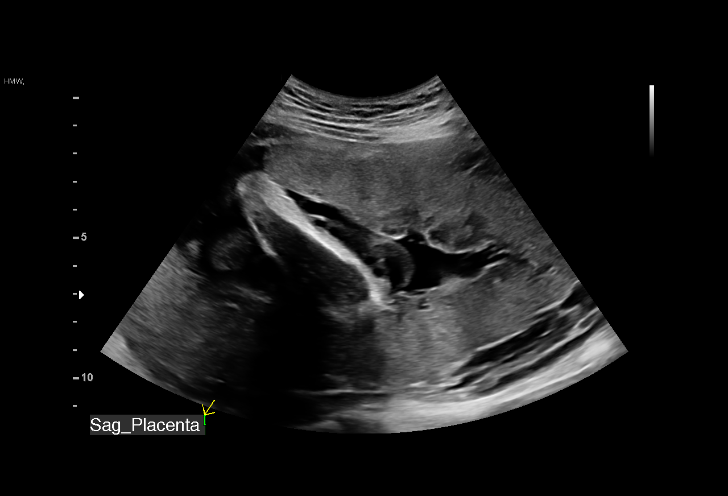
[im 20/32]
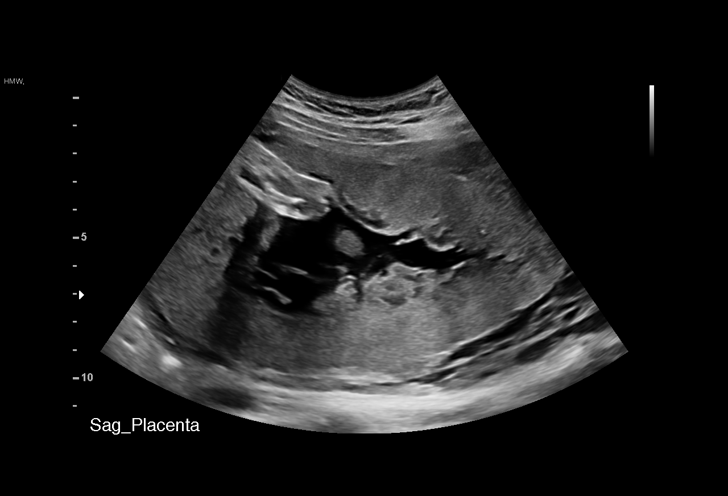
[im 22/32]
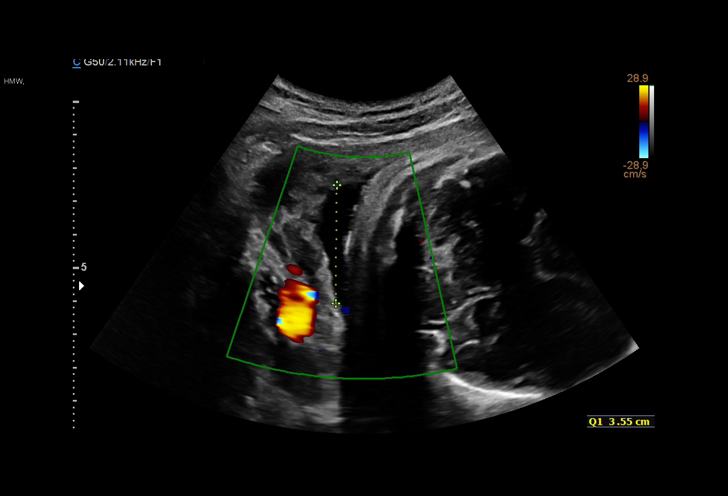
[im 25/32]
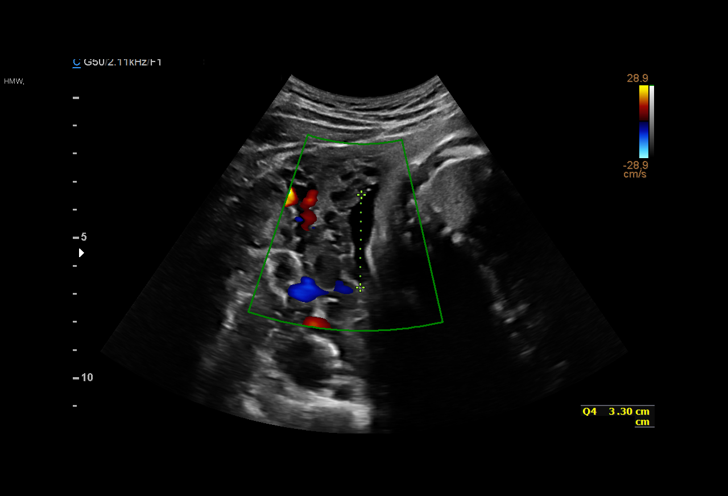
[im 27/32]
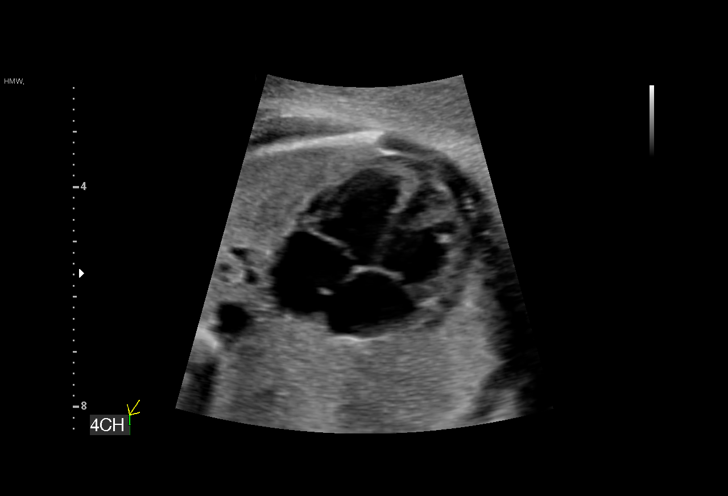
[im 29/32]
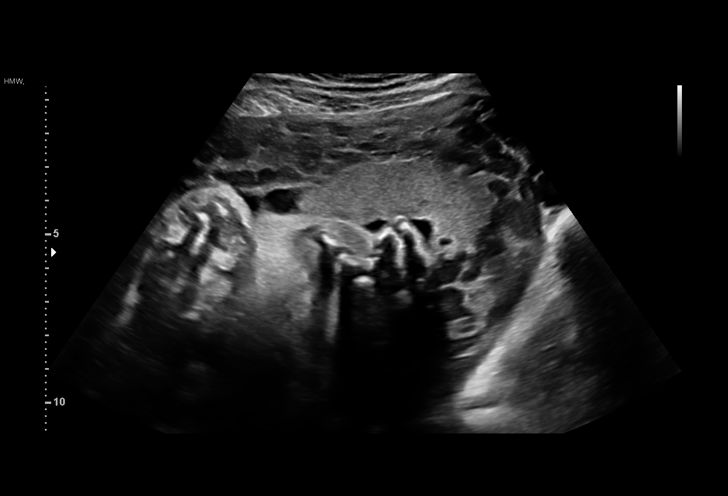
[im 32/32]
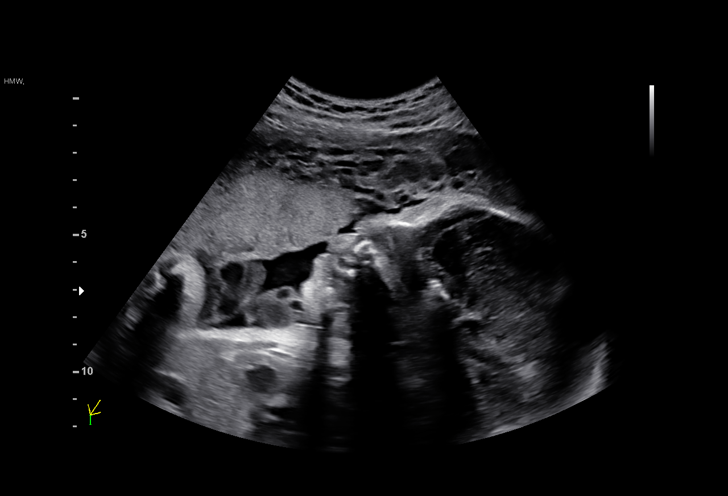

[14 of 28 positions shown; findings below may reference images not displayed]

----------------------------------------------------------------------

 ----------------------------------------------------------------------
Indications

  31 weeks gestation of pregnancy
  Uterine size-date discrepancy, third trimester
 ----------------------------------------------------------------------
Fetal Evaluation

 Num Of Fetuses:         1
 Fetal Heart Rate(bpm):  159
 Cardiac Activity:       Observed
 Presentation:           Cephalic
 Placenta:               Posterior left lateral
 P. Cord Insertion:      Visualized, central

 Amniotic Fluid
 AFI FV:      Within normal limits

 AFI Sum(cm)     %Tile       Largest Pocket(cm)
 11.82           30

 RUQ(cm)       RLQ(cm)       LUQ(cm)        LLQ(cm)

Biometry

 BPD:      77.9  mm     G. Age:  31w 2d         30  %    CI:        73.99   %    70 - 86
                                                         FL/HC:      19.8   %    19.1 -
 HC:      287.6  mm     G. Age:  31w 4d         17  %    HC/AC:      1.06        0.96 -
 AC:      270.9  mm     G. Age:  31w 1d         36  %    FL/BPD:     73.2   %    71 - 87
 FL:         57  mm     G. Age:  29w 6d          6  %    FL/AC:      21.0   %    20 - 24
 HUM:      49.3  mm     G. Age:  29w 0d        < 5  %

 Est. FW:    2711  gm    3 lb 10 oz      41  %
OB History

 Gravidity:    1
Gestational Age

 LMP:           31w 4d        Date:  01/10/18                 EDD:   10/17/18
 U/S Today:     31w 0d                                        EDD:   10/21/18
 Best:          31w 4d     Det. By:  LMP  (01/10/18)          EDD:   10/17/18
Anatomy

 Cranium:               Appears normal         Aortic Arch:            Previously seen
 Cavum:                 Previously seen        Ductal Arch:            Previously seen
 Ventricles:            Appears normal         Diaphragm:              Previously seen
 Choroid Plexus:        Previously seen        Stomach:                Appears normal, left
                                                                       sided
 Cerebellum:            Previously seen        Abdomen:                Previously seen
 Posterior Fossa:       Previously seen        Abdominal Wall:         Previously seen
 Nuchal Fold:           Previously seen        Cord Vessels:           Previously seen
 Face:                  Orbits and profile     Kidneys:                Previously seen
                        previously seen
 Lips:                  Previously seen        Bladder:                Appears normal
 Thoracic:              Appears normal         Spine:                  Appears normal
 Heart:                 Appears normal         Upper Extremities:      Previously seen
                        (4CH, axis, and situs
 RVOT:                  Previously seen        Lower Extremities:      Previously seen
 LVOT:                  Previously seen

 Other:  Male gender. Heels and 5th digit previously visualized. Technically
         difficult due to fetal position.
Cervix Uterus Adnexa

 Cervix
 Not visualized (advanced GA >84wks)

 Uterus
 No abnormality visualized.

 Left Ovary
 No adnexal mass visualized.

 Right Ovary
 No adnexal mass visualized.

 Cul De Sac
 No free fluid seen.

 Adnexa
 No abnormality visualized.
Impression

 Amniotic fluid is normal and good fetal activity is seen. Fetal
 growth is appropriate for gestational age.
Recommendations

 Follow-up as clinically indicated.
                      Miun, Hong Wah
